# Patient Record
Sex: Male | Born: 1945 | Race: White | Hispanic: No | Marital: Single | Smoking: Former smoker
Health system: Southern US, Community
[De-identification: ages and names within clinical notes are randomized; demographics above are authoritative.]

## PROBLEM LIST (undated history)

## (undated) DIAGNOSIS — Z789 Other specified health status: Secondary | ICD-10-CM

---

## 2010-10-15 ENCOUNTER — Encounter (HOSPITAL_BASED_OUTPATIENT_CLINIC_OR_DEPARTMENT_OTHER)
Admission: RE | Admit: 2010-10-15 | Discharge: 2010-10-15 | Disposition: A | Discharge status: Home / Self Care | Payer: 59 | Source: Ambulatory Visit | Attending: Orthopedic Surgery | Admitting: Orthopedic Surgery

## 2010-10-16 ENCOUNTER — Ambulatory Visit (HOSPITAL_BASED_OUTPATIENT_CLINIC_OR_DEPARTMENT_OTHER)
Admission: RE | Admit: 2010-10-16 | Discharge: 2010-10-16 | Disposition: A | Discharge status: Home / Self Care | Payer: 59 | Source: Ambulatory Visit | Attending: Orthopedic Surgery | Admitting: Orthopedic Surgery

## 2010-10-16 DIAGNOSIS — Z01810 Encounter for preprocedural cardiovascular examination: Secondary | ICD-10-CM | POA: Insufficient documentation

## 2010-10-16 DIAGNOSIS — Z01812 Encounter for preprocedural laboratory examination: Secondary | ICD-10-CM | POA: Insufficient documentation

## 2010-10-16 DIAGNOSIS — M6530 Trigger finger, unspecified finger: Secondary | ICD-10-CM | POA: Insufficient documentation

## 2010-11-12 NOTE — Op Note (Signed)
[name], [name] NO.:  [id number]  MEDICAL RECORD NO.:  [id number]  LOCATION:                                 FACILITY:  PHYSICIAN:  Betha Loa, MD        DATE OF BIRTH:  [birth year]  DATE OF PROCEDURE:  10/16/2010 DATE OF DISCHARGE:                              OPERATIVE REPORT   PREOPERATIVE DIAGNOSIS:  Right trigger thumb.  POSTOPERATIVE DIAGNOSIS:  Right trigger thumb.  PROCEDURE:  Right thumb A1 pulley release.  SURGEON:  Betha Loa, MD  ASSISTANT:  None.  ANESTHESIA:  Local with sedation.  INTRAVENOUS FLUIDS:  Per anesthesia flow sheet.  ESTIMATED BLOOD LOSS:  Minimal.  COMPLICATIONS:  None.  SPECIMENS:  None.  TOURNIQUET TIME:  20 minutes.  DISPOSITION:  Stable to PACU.  INDICATIONS:  Mr. Jay Livingston is a 65 year old male who has been seen my office for triggering of the right thumb.  We have injected the right thumb flexor sheath multiple times and the triggering has always recurred.  He now is at a point where he has difficulty flexing the thumb when he is not blocking at the IP joint.  He wishes to undergo release of the A1 pulley for treatment of the triggering.  Risks, benefits, and alternatives of surgery were discussed including the risks of blood loss, infection, damage to nerves, vessels, tendons, ligaments, and bone, failure of surgery, the need for additional surgery, complications with wound healing, continued pain, and continued triggering.  He voiced understanding these risks and elected to proceed.  OPERATIVE COURSE:  After being identified preoperatively by myself, the patient and I agreed upon the procedure and site of procedure.  The surgical site was marked.  The risks, benefits, and alternatives of surgery were reviewed and he wished to proceed.  Surgical consent had been signed.  He was given 1 g of IV Ancef as preoperative antibiotic prophylaxis.  He was transferred to the operating room and placed on  the operating table in supine position with the right upper extremity on an arm board.  General anesthesia was induced by the anesthesiologist.  The right upper extremity was prepped and draped in a normal sterile orthopedic fashion.  Surgical pause was performed between surgeons, Anesthesia, and operating staff and all were in agreement as to the patient, procedure, and site of procedure.  Local anesthesia was given with half-and-half solution of 1% plain Xylocaine and 0.25% of plain Marcaine.  This was adequate to give anesthesia in the operative field. The patient was also under sedation.  An incision was made at the flexion crease of the MP joint of the thumb.  This was made through the skin only.  Spreading technique was used in the deeper tissues.  Digital nerves were identified and were protected throughout the case.  The flexor tendon was identified.  The A1 pulley was identified and was incised in its entirety.  The FPL was brought through the wound and the IP joint was able to flex fully.  The patient was awoken from his sedation.  He was asked to flex the thumb and was able to fully flex the IP joint and fully extend the IP  joint.  He noted no triggering.  I was unable to feel any catching.  The wound was copiously irrigated with sterile saline.  It was closed with 4-0 nylon in a horizontal mattress fashion.  The wound was dressed with sterile Xeroform, 4 x 4's and wrapped with Kling and Coban dressing lightly.  The tourniquet was deflated at 20 minutes.  It had been inflated at the start of the case after exsanguination of the limb with an Esmarch bandage.  It was at 250 mmHg.  The fingertips were pink with brisk capillary refill after deflation of the tourniquet.  The operative drapes were broken down and the patient was awoken from anesthesia safely.  He was transferred back to stretcher and taken to PACU in stable condition.  I will see him back in the office in 1 week for  postoperative followup.  I will give him Percocet 5/325 one to two p.o. q.6 h. p.r.n. pain, dispensed #30.     Betha Loa, MD     KK/MEDQ  D:  10/16/2010  T:  10/16/2010  Job:  [id number]  Electronically Signed by Betha Loa  on 11/12/2010 11:29:32 AM

## 2016-07-29 ENCOUNTER — Inpatient Hospital Stay (HOSPITAL_COMMUNITY): Payer: Medicare Other

## 2016-07-29 ENCOUNTER — Inpatient Hospital Stay (HOSPITAL_COMMUNITY)
Admission: EM | Admit: 2016-07-29 | Discharge: 2016-09-02 | DRG: 215 | Disposition: E | Payer: Medicare Other | Attending: Interventional Cardiology | Admitting: Interventional Cardiology

## 2016-07-29 ENCOUNTER — Encounter (HOSPITAL_COMMUNITY): Payer: Self-pay | Admitting: Interventional Cardiology

## 2016-07-29 ENCOUNTER — Inpatient Hospital Stay (HOSPITAL_COMMUNITY): Admission: EM | Disposition: E | Payer: Self-pay | Source: Home / Self Care | Attending: Interventional Cardiology

## 2016-07-29 DIAGNOSIS — J9600 Acute respiratory failure, unspecified whether with hypoxia or hypercapnia: Secondary | ICD-10-CM

## 2016-07-29 DIAGNOSIS — Z515 Encounter for palliative care: Secondary | ICD-10-CM | POA: Diagnosis present

## 2016-07-29 DIAGNOSIS — I469 Cardiac arrest, cause unspecified: Secondary | ICD-10-CM

## 2016-07-29 DIAGNOSIS — R0689 Other abnormalities of breathing: Secondary | ICD-10-CM | POA: Diagnosis present

## 2016-07-29 DIAGNOSIS — N390 Urinary tract infection, site not specified: Secondary | ICD-10-CM | POA: Diagnosis present

## 2016-07-29 DIAGNOSIS — I998 Other disorder of circulatory system: Secondary | ICD-10-CM | POA: Diagnosis present

## 2016-07-29 DIAGNOSIS — Z9889 Other specified postprocedural states: Secondary | ICD-10-CM

## 2016-07-29 DIAGNOSIS — K7200 Acute and subacute hepatic failure without coma: Secondary | ICD-10-CM | POA: Diagnosis present

## 2016-07-29 DIAGNOSIS — I472 Ventricular tachycardia, unspecified: Secondary | ICD-10-CM

## 2016-07-29 DIAGNOSIS — E875 Hyperkalemia: Secondary | ICD-10-CM | POA: Diagnosis present

## 2016-07-29 DIAGNOSIS — G9340 Encephalopathy, unspecified: Secondary | ICD-10-CM | POA: Diagnosis not present

## 2016-07-29 DIAGNOSIS — I2510 Atherosclerotic heart disease of native coronary artery without angina pectoris: Secondary | ICD-10-CM | POA: Diagnosis present

## 2016-07-29 DIAGNOSIS — R740 Nonspecific elevation of levels of transaminase and lactic acid dehydrogenase [LDH]: Secondary | ICD-10-CM | POA: Diagnosis present

## 2016-07-29 DIAGNOSIS — I25119 Atherosclerotic heart disease of native coronary artery with unspecified angina pectoris: Secondary | ICD-10-CM

## 2016-07-29 DIAGNOSIS — E876 Hypokalemia: Secondary | ICD-10-CM | POA: Diagnosis present

## 2016-07-29 DIAGNOSIS — I213 ST elevation (STEMI) myocardial infarction of unspecified site: Principal | ICD-10-CM | POA: Diagnosis present

## 2016-07-29 DIAGNOSIS — D62 Acute posthemorrhagic anemia: Secondary | ICD-10-CM | POA: Diagnosis present

## 2016-07-29 DIAGNOSIS — J9690 Respiratory failure, unspecified, unspecified whether with hypoxia or hypercapnia: Secondary | ICD-10-CM

## 2016-07-29 DIAGNOSIS — G931 Anoxic brain damage, not elsewhere classified: Secondary | ICD-10-CM | POA: Diagnosis present

## 2016-07-29 DIAGNOSIS — J810 Acute pulmonary edema: Secondary | ICD-10-CM

## 2016-07-29 DIAGNOSIS — Z9289 Personal history of other medical treatment: Secondary | ICD-10-CM

## 2016-07-29 DIAGNOSIS — E872 Acidosis: Secondary | ICD-10-CM | POA: Diagnosis present

## 2016-07-29 DIAGNOSIS — R570 Cardiogenic shock: Secondary | ICD-10-CM | POA: Diagnosis present

## 2016-07-29 DIAGNOSIS — R7989 Other specified abnormal findings of blood chemistry: Secondary | ICD-10-CM | POA: Diagnosis present

## 2016-07-29 DIAGNOSIS — Z452 Encounter for adjustment and management of vascular access device: Secondary | ICD-10-CM

## 2016-07-29 DIAGNOSIS — I629 Nontraumatic intracranial hemorrhage, unspecified: Secondary | ICD-10-CM

## 2016-07-29 DIAGNOSIS — Z4659 Encounter for fitting and adjustment of other gastrointestinal appliance and device: Secondary | ICD-10-CM

## 2016-07-29 DIAGNOSIS — M62261 Nontraumatic ischemic infarction of muscle, right lower leg: Secondary | ICD-10-CM | POA: Diagnosis not present

## 2016-07-29 DIAGNOSIS — B952 Enterococcus as the cause of diseases classified elsewhere: Secondary | ICD-10-CM | POA: Diagnosis present

## 2016-07-29 DIAGNOSIS — N170 Acute kidney failure with tubular necrosis: Secondary | ICD-10-CM | POA: Diagnosis present

## 2016-07-29 DIAGNOSIS — R739 Hyperglycemia, unspecified: Secondary | ICD-10-CM | POA: Diagnosis present

## 2016-07-29 DIAGNOSIS — Z87891 Personal history of nicotine dependence: Secondary | ICD-10-CM

## 2016-07-29 DIAGNOSIS — R0602 Shortness of breath: Secondary | ICD-10-CM

## 2016-07-29 DIAGNOSIS — I5021 Acute systolic (congestive) heart failure: Secondary | ICD-10-CM | POA: Diagnosis present

## 2016-07-29 DIAGNOSIS — E871 Hypo-osmolality and hyponatremia: Secondary | ICD-10-CM | POA: Diagnosis present

## 2016-07-29 DIAGNOSIS — J9601 Acute respiratory failure with hypoxia: Secondary | ICD-10-CM

## 2016-07-29 DIAGNOSIS — N179 Acute kidney failure, unspecified: Secondary | ICD-10-CM | POA: Diagnosis not present

## 2016-07-29 DIAGNOSIS — L8990 Pressure ulcer of unspecified site, unspecified stage: Secondary | ICD-10-CM | POA: Diagnosis present

## 2016-07-29 DIAGNOSIS — I509 Heart failure, unspecified: Secondary | ICD-10-CM

## 2016-07-29 HISTORY — PX: VENTRICULAR ASSIST DEVICE INSERTION: CATH118273

## 2016-07-29 HISTORY — DX: Other specified health status: Z78.9

## 2016-07-29 HISTORY — PX: LEFT HEART CATH AND CORONARY ANGIOGRAPHY: CATH118249

## 2016-07-29 LAB — POCT I-STAT, CHEM 8
BUN: 13 mg/dL (ref 6–20)
BUN: 16 mg/dL (ref 6–20)
BUN: 15 mg/dL (ref 6–20)
BUN: 15 mg/dL (ref 6–20)
BUN: 14 mg/dL (ref 6–20)
CHLORIDE: 105 mmol/L (ref 101–111)
CHLORIDE: 104 mmol/L (ref 101–111)
CHLORIDE: 105 mmol/L (ref 101–111)
CREATININE: 0.9 mg/dL (ref 0.61–1.24)
CREATININE: 0.7 mg/dL (ref 0.61–1.24)
CREATININE: 0.8 mg/dL (ref 0.61–1.24)
Calcium, Ion: 1.14 mmol/L — ABNORMAL LOW (ref 1.15–1.40)
Calcium, Ion: 1.17 mmol/L (ref 1.15–1.40)
Calcium, Ion: 1.17 mmol/L (ref 1.15–1.40)
Calcium, Ion: 1.18 mmol/L (ref 1.15–1.40)
Calcium, Ion: 1.2 mmol/L (ref 1.15–1.40)
Chloride: 104 mmol/L (ref 101–111)
Chloride: 107 mmol/L (ref 101–111)
Creatinine, Ser: 0.9 mg/dL (ref 0.61–1.24)
Creatinine, Ser: 0.8 mg/dL (ref 0.61–1.24)
GLUCOSE: 358 mg/dL — AB (ref 65–99)
GLUCOSE: 355 mg/dL — AB (ref 65–99)
GLUCOSE: 307 mg/dL — AB (ref 65–99)
GLUCOSE: 147 mg/dL — AB (ref 65–99)
Glucose, Bld: 211 mg/dL — ABNORMAL HIGH (ref 65–99)
HCT: 42 % (ref 39.0–52.0)
HCT: 41 % (ref 39.0–52.0)
HCT: 36 % — ABNORMAL LOW (ref 39.0–52.0)
HEMATOCRIT: 44 % (ref 39.0–52.0)
HEMATOCRIT: 37 % — AB (ref 39.0–52.0)
HEMOGLOBIN: 15 g/dL (ref 13.0–17.0)
HEMOGLOBIN: 13.9 g/dL (ref 13.0–17.0)
HEMOGLOBIN: 12.6 g/dL — AB (ref 13.0–17.0)
HEMOGLOBIN: 12.2 g/dL — AB (ref 13.0–17.0)
Hemoglobin: 14.3 g/dL (ref 13.0–17.0)
POTASSIUM: 3.6 mmol/L (ref 3.5–5.1)
POTASSIUM: 4.1 mmol/L (ref 3.5–5.1)
POTASSIUM: 3.6 mmol/L (ref 3.5–5.1)
Potassium: 4.1 mmol/L (ref 3.5–5.1)
Potassium: 3.2 mmol/L — ABNORMAL LOW (ref 3.5–5.1)
Sodium: 140 mmol/L (ref 135–145)
Sodium: 140 mmol/L (ref 135–145)
Sodium: 140 mmol/L (ref 135–145)
Sodium: 141 mmol/L (ref 135–145)
Sodium: 142 mmol/L (ref 135–145)
TCO2: 21 mmol/L (ref 0–100)
TCO2: 24 mmol/L (ref 0–100)
TCO2: 25 mmol/L (ref 0–100)
TCO2: 25 mmol/L (ref 0–100)
TCO2: 21 mmol/L (ref 0–100)

## 2016-07-29 LAB — POCT I-STAT 3, ART BLOOD GAS (G3+)
Acid-base deficit: 5 mmol/L — ABNORMAL HIGH (ref 0.0–2.0)
BICARBONATE: 24 mmol/L (ref 20.0–28.0)
Bicarbonate: 20.8 mmol/L (ref 20.0–28.0)
O2 SAT: 98 %
O2 Saturation: 100 %
PCO2 ART: 40.1 mmHg (ref 32.0–48.0)
PH ART: 7.467 — AB (ref 7.350–7.450)
PO2 ART: 265 mmHg — AB (ref 83.0–108.0)
Patient temperature: 92.4
TCO2: 22 mmol/L (ref 0–100)
TCO2: 25 mmol/L (ref 0–100)
pCO2 arterial: 32.1 mmHg (ref 32.0–48.0)
pH, Arterial: 7.323 — ABNORMAL LOW (ref 7.350–7.450)
pO2, Arterial: 110 mmHg — ABNORMAL HIGH (ref 83.0–108.0)

## 2016-07-29 LAB — URINALYSIS, ROUTINE W REFLEX MICROSCOPIC
BILIRUBIN URINE: NEGATIVE
KETONES UR: NEGATIVE mg/dL
LEUKOCYTES UA: NEGATIVE
NITRITE: NEGATIVE
PH: 5 (ref 5.0–8.0)
Protein, ur: 100 mg/dL — AB

## 2016-07-29 LAB — PROTIME-INR
INR: 1.09
INR: 1.18
PROTHROMBIN TIME: 14.1 s (ref 11.4–15.2)
Prothrombin Time: 15.1 seconds (ref 11.4–15.2)

## 2016-07-29 LAB — BASIC METABOLIC PANEL
ANION GAP: 3 — AB (ref 5–15)
Anion gap: 5 (ref 5–15)
BUN: 13 mg/dL (ref 6–20)
BUN: 12 mg/dL (ref 6–20)
CALCIUM: 7.9 mg/dL — AB (ref 8.9–10.3)
CALCIUM: 7.8 mg/dL — AB (ref 8.9–10.3)
CO2: 22 mmol/L (ref 22–32)
CO2: 24 mmol/L (ref 22–32)
CREATININE: 0.88 mg/dL (ref 0.61–1.24)
Chloride: 111 mmol/L (ref 101–111)
Chloride: 112 mmol/L — ABNORMAL HIGH (ref 101–111)
Creatinine, Ser: 0.97 mg/dL (ref 0.61–1.24)
GFR calc Af Amer: >60 mL/min (ref >60)
GFR calc Af Amer: >60 mL/min (ref >60)
GLUCOSE: 155 mg/dL — AB (ref 65–99)
GLUCOSE: 148 mg/dL — AB (ref 65–99)
Potassium: 3.2 mmol/L — ABNORMAL LOW (ref 3.5–5.1)
Potassium: 3.5 mmol/L (ref 3.5–5.1)
Sodium: 138 mmol/L (ref 135–145)
Sodium: 139 mmol/L (ref 135–145)

## 2016-07-29 LAB — COMPREHENSIVE METABOLIC PANEL
ALT: 82 U/L — ABNORMAL HIGH (ref 17–63)
ANION GAP: 9 (ref 5–15)
AST: 127 U/L — ABNORMAL HIGH (ref 15–41)
Albumin: 3.1 g/dL — ABNORMAL LOW (ref 3.5–5.0)
Alkaline Phosphatase: 55 U/L (ref 38–126)
BUN: 10 mg/dL (ref 6–20)
CHLORIDE: 108 mmol/L (ref 101–111)
CO2: 21 mmol/L — AB (ref 22–32)
Calcium: 8 mg/dL — ABNORMAL LOW (ref 8.9–10.3)
Creatinine, Ser: 1.14 mg/dL (ref 0.61–1.24)
Glucose, Bld: 346 mg/dL — ABNORMAL HIGH (ref 65–99)
POTASSIUM: 3.5 mmol/L (ref 3.5–5.1)
SODIUM: 138 mmol/L (ref 135–145)
Total Bilirubin: 1 mg/dL (ref 0.3–1.2)
Total Protein: 6 g/dL — ABNORMAL LOW (ref 6.5–8.1)

## 2016-07-29 LAB — GLUCOSE, CAPILLARY
GLUCOSE-CAPILLARY: 249 mg/dL — AB (ref 65–99)
GLUCOSE-CAPILLARY: 148 mg/dL — AB (ref 65–99)
GLUCOSE-CAPILLARY: 143 mg/dL — AB (ref 65–99)
GLUCOSE-CAPILLARY: 132 mg/dL — AB (ref 65–99)
Glucose-Capillary: 317 mg/dL — ABNORMAL HIGH (ref 65–99)
Glucose-Capillary: 144 mg/dL — ABNORMAL HIGH (ref 65–99)
Glucose-Capillary: 128 mg/dL — ABNORMAL HIGH (ref 65–99)

## 2016-07-29 LAB — CBC
HCT: 42.6 % (ref 39.0–52.0)
Hemoglobin: 13.9 g/dL (ref 13.0–17.0)
MCH: 30.5 pg (ref 26.0–34.0)
MCHC: 32.6 g/dL (ref 30.0–36.0)
MCV: 93.6 fL (ref 78.0–100.0)
Platelets: 166 10*3/uL (ref 150–400)
RBC: 4.55 MIL/uL (ref 4.22–5.81)
RDW: 13.4 % (ref 11.5–15.5)
WBC: 12.2 10*3/uL — ABNORMAL HIGH (ref 4.0–10.5)

## 2016-07-29 LAB — LIPID PANEL
CHOL/HDL RATIO: 4.6 ratio
CHOLESTEROL: 189 mg/dL (ref 0–200)
HDL: 41 mg/dL (ref >40)
LDL CALC: 131 mg/dL — AB (ref 0–99)
TRIGLYCERIDES: 83 mg/dL (ref <150)
VLDL: 17 mg/dL (ref 0–40)

## 2016-07-29 LAB — SURGICAL PCR SCREEN
MRSA, PCR: NEGATIVE
Staphylococcus aureus: POSITIVE — AB

## 2016-07-29 LAB — APTT
APTT: 63 s — AB (ref 24–36)
aPTT: 31 seconds (ref 24–36)

## 2016-07-29 LAB — POCT ACTIVATED CLOTTING TIME
ACTIVATED CLOTTING TIME: 164 s
Activated Clotting Time: 296 seconds
Activated Clotting Time: 136 seconds
Activated Clotting Time: 136 seconds

## 2016-07-29 LAB — TROPONIN I
TROPONIN I: 2.64 ng/mL — AB (ref <0.03)
Troponin I: 0.07 ng/mL (ref <0.03)

## 2016-07-29 LAB — ECHOCARDIOGRAM COMPLETE
Height: 70 in
Weight: 3160.51 oz

## 2016-07-29 LAB — MAGNESIUM: Magnesium: 1.8 mg/dL (ref 1.7–2.4)

## 2016-07-29 SURGERY — LEFT HEART CATH AND CORONARY ANGIOGRAPHY
Anesthesia: LOCAL

## 2016-07-29 MED ORDER — SODIUM CHLORIDE 0.9 % IV SOLN
INTRAVENOUS | Status: DC | PRN
  Administered 2016-07-29: 1.75 mg/kg/h via INTRAVENOUS

## 2016-07-29 MED ORDER — MIDAZOLAM HCL 2 MG/2ML IJ SOLN: 1.0000 mg | Freq: Once | INTRAMUSCULAR | Status: DC

## 2016-07-29 MED ORDER — NOREPINEPHRINE BITARTRATE 1 MG/ML IV SOLN
0.0000 ug/min | INTRAVENOUS | Status: DC
  Administered 2016-07-29: 2 ug/min via INTRAVENOUS
  Administered 2016-07-30: 18 ug/min via INTRAVENOUS
  Administered 2016-07-30: 24 ug/min via INTRAVENOUS
  Administered 2016-07-30: 30 ug/min via INTRAVENOUS
  Administered 2016-07-30: 11 ug/min via INTRAVENOUS
  Administered 2016-07-31: 14 ug/min via INTRAVENOUS
  Administered 2016-07-31: 15 ug/min via INTRAVENOUS
  Administered 2016-08-01: 16 ug/min via INTRAVENOUS
  Administered 2016-08-01: 25 ug/min via INTRAVENOUS
  Administered 2016-08-01: 20 ug/min via INTRAVENOUS
  Administered 2016-08-01: 15 ug/min via INTRAVENOUS
  Administered 2016-08-01: 25 ug/min via INTRAVENOUS
  Filled 2016-07-29 (×16): qty 4

## 2016-07-29 MED ORDER — CHLORHEXIDINE GLUCONATE CLOTH 2 % EX PADS
6.0000 | MEDICATED_PAD | Freq: Every day | CUTANEOUS | Status: DC
  Administered 2016-07-29 – 2016-07-30 (×2): 6 via TOPICAL

## 2016-07-29 MED ORDER — SODIUM CHLORIDE 0.9 % IV SOLN
INTRAVENOUS | Status: DC
  Administered 2016-07-29: 2.6 [IU]/h via INTRAVENOUS
  Filled 2016-07-29 (×2): qty 1

## 2016-07-29 MED ORDER — LIDOCAINE HCL (PF) 1 % IJ SOLN
INTRAMUSCULAR | Status: DC | PRN
  Administered 2016-07-29 (×2): 15 mL

## 2016-07-29 MED ORDER — CISATRACURIUM BOLUS VIA INFUSION: 0.0500 mg/kg | INTRAVENOUS | Status: DC | PRN

## 2016-07-29 MED ORDER — FENTANYL CITRATE (PF) 100 MCG/2ML IJ SOLN: 100.0000 ug | Freq: Once | INTRAMUSCULAR | Status: DC | PRN

## 2016-07-29 MED ORDER — NITROGLYCERIN 1 MG/10 ML FOR IR/CATH LAB
INTRA_ARTERIAL | Status: AC
  Filled 2016-07-29: qty 10

## 2016-07-29 MED ORDER — AMIODARONE HCL IN DEXTROSE 360-4.14 MG/200ML-% IV SOLN
30.0000 mg/h | INTRAVENOUS | Status: DC
  Administered 2016-07-30 – 2016-07-31 (×5): 60 mg/h via INTRAVENOUS
  Administered 2016-07-31 – 2016-08-04 (×7): 30 mg/h via INTRAVENOUS
  Filled 2016-07-29 (×17): qty 200

## 2016-07-29 MED ORDER — SODIUM CHLORIDE 0.9 % IV SOLN
INTRAVENOUS | Status: AC | PRN
  Administered 2016-07-29: 100 mL/h via INTRAVENOUS

## 2016-07-29 MED ORDER — AMIODARONE HCL IN DEXTROSE 360-4.14 MG/200ML-% IV SOLN: 30.0000 mg/h | INTRAVENOUS | Status: DC

## 2016-07-29 MED ORDER — FENTANYL CITRATE (PF) 100 MCG/2ML IJ SOLN: 100.0000 ug | Freq: Once | INTRAMUSCULAR | Status: DC

## 2016-07-29 MED ORDER — ASPIRIN 300 MG RE SUPP
300.0000 mg | RECTAL | Status: AC
  Administered 2016-07-29: 300 mg via RECTAL
  Filled 2016-07-29: qty 1

## 2016-07-29 MED ORDER — PNEUMOCOCCAL VAC POLYVALENT 25 MCG/0.5ML IJ INJ: 0.5000 mL | INJECTION | INTRAMUSCULAR | Status: DC | PRN

## 2016-07-29 MED ORDER — SODIUM CHLORIDE 0.9 % IV SOLN: 2.0000 mg/h | INTRAVENOUS | Status: DC

## 2016-07-29 MED ORDER — HEPARIN (PORCINE) IN NACL 2-0.9 UNIT/ML-% IJ SOLN
INTRAMUSCULAR | Status: AC
  Filled 2016-07-29: qty 1000

## 2016-07-29 MED ORDER — ATROPINE SULFATE 1 MG/10ML IJ SOSY
PREFILLED_SYRINGE | INTRAMUSCULAR | Status: AC
  Filled 2016-07-29: qty 10

## 2016-07-29 MED ORDER — CISATRACURIUM BESYLATE (PF) 200 MG/20ML IV SOLN: 1.0000 ug/kg/min | INTRAVENOUS | Status: DC

## 2016-07-29 MED ORDER — MIDAZOLAM HCL 2 MG/2ML IJ SOLN: 2.0000 mg | Freq: Once | INTRAMUSCULAR | Status: DC | PRN

## 2016-07-29 MED ORDER — AMIODARONE LOAD VIA INFUSION
150.0000 mg | Freq: Once | INTRAVENOUS | Status: AC
  Administered 2016-07-29: 150 mg via INTRAVENOUS
  Filled 2016-07-29: qty 83.34

## 2016-07-29 MED ORDER — ASPIRIN 81 MG PO CHEW
81.0000 mg | CHEWABLE_TABLET | Freq: Every day | ORAL | Status: DC
  Administered 2016-07-30 – 2016-08-05 (×7): 81 mg via ORAL
  Filled 2016-07-29 (×7): qty 1

## 2016-07-29 MED ORDER — SODIUM CHLORIDE 0.9% FLUSH
3.0000 mL | Freq: Two times a day (BID) | INTRAVENOUS | Status: DC
  Administered 2016-07-29: 3 mL via INTRAVENOUS

## 2016-07-29 MED ORDER — SODIUM CHLORIDE 0.9 % IV SOLN
INTRAVENOUS | Status: DC
  Administered 2016-07-29: 14:00:00 via INTRAVENOUS
  Administered 2016-07-30: 50 mL/h via INTRAVENOUS
  Administered 2016-07-31: 15:00:00 via INTRAVENOUS

## 2016-07-29 MED ORDER — MIDAZOLAM BOLUS VIA INFUSION
1.0000 mg | INTRAVENOUS | Status: DC | PRN
  Filled 2016-07-29: qty 1

## 2016-07-29 MED ORDER — ACETAMINOPHEN 325 MG PO TABS: 650.0000 mg | ORAL_TABLET | ORAL | Status: DC | PRN

## 2016-07-29 MED ORDER — BIVALIRUDIN BOLUS VIA INFUSION - CUPID
INTRAVENOUS | Status: DC | PRN
  Administered 2016-07-29: 76.575 mg via INTRAVENOUS

## 2016-07-29 MED ORDER — CISATRACURIUM BOLUS VIA INFUSION
0.0500 mg/kg | INTRAVENOUS | Status: DC | PRN
  Filled 2016-07-29: qty 5

## 2016-07-29 MED ORDER — MUPIROCIN 2 % EX OINT
1.0000 "application " | TOPICAL_OINTMENT | Freq: Two times a day (BID) | CUTANEOUS | Status: AC
  Administered 2016-07-29 – 2016-08-03 (×10): 1 via NASAL
  Filled 2016-07-29 (×3): qty 22

## 2016-07-29 MED ORDER — MIDAZOLAM BOLUS VIA INFUSION: 2.0000 mg | INTRAVENOUS | Status: DC | PRN

## 2016-07-29 MED ORDER — SODIUM CHLORIDE 0.9 % IV SOLN
2.0000 mg/h | INTRAVENOUS | Status: DC
  Administered 2016-07-29: 2 mg/h via INTRAVENOUS
  Administered 2016-07-29 – 2016-07-30 (×3): 4 mg/h via INTRAVENOUS
  Filled 2016-07-29 (×4): qty 10

## 2016-07-29 MED ORDER — LIDOCAINE HCL (PF) 1 % IJ SOLN
INTRAMUSCULAR | Status: AC
  Filled 2016-07-29: qty 30

## 2016-07-29 MED ORDER — SODIUM CHLORIDE 0.9% FLUSH: 3.0000 mL | INTRAVENOUS | Status: DC | PRN

## 2016-07-29 MED ORDER — BIVALIRUDIN TRIFLUOROACETATE 250 MG IV SOLR
INTRAVENOUS | Status: AC
  Filled 2016-07-29: qty 250

## 2016-07-29 MED ORDER — SODIUM CHLORIDE 0.9 % IV SOLN: 1.0000 ug/kg/min | INTRAVENOUS | Status: DC

## 2016-07-29 MED ORDER — HEPARIN SODIUM (PORCINE) 5000 UNIT/ML IJ SOLN
50000.0000 [IU] | INTRAVENOUS | Status: DC
  Administered 2016-07-29 – 2016-07-31 (×3): 50000 [IU]
  Filled 2016-07-29 (×4): qty 10

## 2016-07-29 MED ORDER — MIDAZOLAM HCL 2 MG/2ML IJ SOLN: 2.0000 mg | Freq: Once | INTRAMUSCULAR | Status: DC

## 2016-07-29 MED ORDER — AMIODARONE LOAD VIA INFUSION
150.0000 mg | Freq: Once | INTRAVENOUS | Status: DC
  Filled 2016-07-29: qty 83.34

## 2016-07-29 MED ORDER — LABETALOL HCL 5 MG/ML IV SOLN: 10.0000 mg | INTRAVENOUS | Status: AC | PRN

## 2016-07-29 MED ORDER — FENTANYL 2500MCG IN NS 250ML (10MCG/ML) PREMIX INFUSION: 100.0000 ug/h | INTRAVENOUS | Status: DC

## 2016-07-29 MED ORDER — FENTANYL CITRATE (PF) 100 MCG/2ML IJ SOLN: 50.0000 ug | Freq: Once | INTRAMUSCULAR | Status: DC

## 2016-07-29 MED ORDER — CISATRACURIUM BOLUS VIA INFUSION
0.1000 mg/kg | Freq: Once | INTRAVENOUS | Status: DC
  Administered 2016-07-29: 10 mg via INTRAVENOUS

## 2016-07-29 MED ORDER — ASPIRIN 300 MG RE SUPP: 300.0000 mg | RECTAL | Status: DC

## 2016-07-29 MED ORDER — SODIUM CHLORIDE 0.9 % IV SOLN
1.0000 mg/h | INTRAVENOUS | Status: DC
  Administered 2016-07-29: 4 mg/h via INTRAVENOUS
  Administered 2016-07-29: 2 mg/h via INTRAVENOUS
  Administered 2016-07-29: 4 mg/h via INTRAVENOUS
  Filled 2016-07-29: qty 10

## 2016-07-29 MED ORDER — HEPARIN SODIUM (PORCINE) 5000 UNIT/ML IJ SOLN: 5000.0000 [IU] | Freq: Three times a day (TID) | INTRAMUSCULAR | Status: DC

## 2016-07-29 MED ORDER — ARTIFICIAL TEARS OPHTHALMIC OINT
1.0000 "application " | TOPICAL_OINTMENT | Freq: Three times a day (TID) | OPHTHALMIC | Status: DC
  Administered 2016-07-29 – 2016-07-31 (×5): 1 via OPHTHALMIC
  Filled 2016-07-29 (×2): qty 3.5

## 2016-07-29 MED ORDER — FENTANYL 2500MCG IN NS 250ML (10MCG/ML) PREMIX INFUSION
100.0000 ug/h | INTRAVENOUS | Status: DC
  Administered 2016-07-29: 300 ug/h via INTRAVENOUS
  Filled 2016-07-29: qty 250

## 2016-07-29 MED ORDER — PANTOPRAZOLE SODIUM 40 MG IV SOLR
40.0000 mg | Freq: Every day | INTRAVENOUS | Status: DC
  Administered 2016-07-29 – 2016-07-31 (×3): 40 mg via INTRAVENOUS
  Filled 2016-07-29 (×3): qty 40

## 2016-07-29 MED ORDER — HEPARIN (PORCINE) IN NACL 100-0.45 UNIT/ML-% IJ SOLN
500.0000 [IU]/h | INTRAMUSCULAR | Status: DC
  Administered 2016-07-29: 200 [IU]/h via INTRAVENOUS
  Administered 2016-07-31: 600 [IU]/h via INTRAVENOUS
  Filled 2016-07-29 (×2): qty 250

## 2016-07-29 MED ORDER — AMIODARONE HCL IN DEXTROSE 360-4.14 MG/200ML-% IV SOLN
60.0000 mg/h | INTRAVENOUS | Status: AC
  Administered 2016-07-29 (×2): 60 mg/h via INTRAVENOUS
  Filled 2016-07-29: qty 200

## 2016-07-29 MED ORDER — SODIUM CHLORIDE 0.9 % IV SOLN
25.0000 ug/h | INTRAVENOUS | Status: DC
  Administered 2016-07-29: 5 ug/h via INTRAVENOUS
  Filled 2016-07-29 (×2): qty 50

## 2016-07-29 MED ORDER — SODIUM CHLORIDE 0.9 % IV SOLN: 250.0000 mL | INTRAVENOUS | Status: DC | PRN

## 2016-07-29 MED ORDER — ORAL CARE MOUTH RINSE
15.0000 mL | OROMUCOSAL | Status: DC
  Administered 2016-07-29 – 2016-08-05 (×66): 15 mL via OROMUCOSAL

## 2016-07-29 MED ORDER — FENTANYL BOLUS VIA INFUSION
25.0000 ug | INTRAVENOUS | Status: DC | PRN
  Filled 2016-07-29: qty 25

## 2016-07-29 MED ORDER — AMIODARONE HCL IN DEXTROSE 360-4.14 MG/200ML-% IV SOLN: 60.0000 mg/h | INTRAVENOUS | Status: DC

## 2016-07-29 MED ORDER — CHLORHEXIDINE GLUCONATE 0.12% ORAL RINSE (MEDLINE KIT)
15.0000 mL | Freq: Two times a day (BID) | OROMUCOSAL | Status: DC
  Administered 2016-07-29 – 2016-08-05 (×15): 15 mL via OROMUCOSAL

## 2016-07-29 MED ORDER — CISATRACURIUM BOLUS VIA INFUSION
0.1000 mg/kg | Freq: Once | INTRAVENOUS | Status: AC
  Administered 2016-07-29: 10 mg via INTRAVENOUS
  Filled 2016-07-29: qty 10

## 2016-07-29 MED ORDER — POTASSIUM CHLORIDE 10 MEQ/50ML IV SOLN
10.0000 meq | INTRAVENOUS | Status: AC
  Administered 2016-07-29 (×2): 10 meq via INTRAVENOUS
  Filled 2016-07-29 (×2): qty 50

## 2016-07-29 MED ORDER — NOREPINEPHRINE BITARTRATE 1 MG/ML IV SOLN
0.0000 ug/min | INTRAVENOUS | Status: DC
  Filled 2016-07-29: qty 4

## 2016-07-29 MED ORDER — IOPAMIDOL (ISOVUE-370) INJECTION 76%
INTRAVENOUS | Status: AC
  Filled 2016-07-29: qty 125

## 2016-07-29 MED ORDER — SODIUM CHLORIDE 0.9 % IV SOLN
1.0000 ug/kg/min | INTRAVENOUS | Status: DC
  Administered 2016-07-29: 1 ug/kg/min via INTRAVENOUS
  Administered 2016-07-30: 1.5 ug/kg/min via INTRAVENOUS
  Filled 2016-07-29 (×2): qty 20

## 2016-07-29 MED ORDER — IOPAMIDOL (ISOVUE-370) INJECTION 76%
INTRAVENOUS | Status: DC | PRN
  Administered 2016-07-29: 95 mL

## 2016-07-29 MED ORDER — ONDANSETRON HCL 4 MG/2ML IJ SOLN: 4.0000 mg | Freq: Four times a day (QID) | INTRAMUSCULAR | Status: DC | PRN

## 2016-07-29 MED ORDER — HYDRALAZINE HCL 20 MG/ML IJ SOLN: 5.0000 mg | INTRAMUSCULAR | Status: AC | PRN

## 2016-07-29 MED ORDER — FENTANYL 2500MCG IN NS 250ML (10MCG/ML) PREMIX INFUSION
25.0000 ug/h | INTRAVENOUS | Status: DC
  Administered 2016-07-30 (×3): 300 ug/h via INTRAVENOUS
  Filled 2016-07-29 (×3): qty 250

## 2016-07-29 SURGICAL SUPPLY — 15 items
CATH EXTRAC PRONTO 5.5F 138CM (CATHETERS) ×2 IMPLANT
CATH INFINITI 5FR MULTPACK ANG (CATHETERS) ×2 IMPLANT
CATH LAUNCHER 6FR EBU3.5 (CATHETERS) ×2 IMPLANT
HOVERMATT SINGLE USE (MISCELLANEOUS) ×2 IMPLANT
KIT ENCORE 26 ADVANTAGE (KITS) ×2 IMPLANT
KIT HEART LEFT (KITS) ×2 IMPLANT
PACK CARDIAC CATHETERIZATION (CUSTOM PROCEDURE TRAY) ×2 IMPLANT
SET IMPELLA CP PUMP (CATHETERS) ×2 IMPLANT
SHEATH PINNACLE 6F 10CM (SHEATH) ×4 IMPLANT
TRANSDUCER W/STOPCOCK (MISCELLANEOUS) ×2 IMPLANT
TUBING CIL FLEX 10 FLL-RA (TUBING) ×2 IMPLANT
VALVE GUARDIAN II ~~LOC~~ HEMO (MISCELLANEOUS) ×2 IMPLANT
WIRE ASAHI PROWATER 180CM (WIRE) ×2 IMPLANT
WIRE EMERALD 3MM-J .035X150CM (WIRE) ×2 IMPLANT
WIRE FIGHTER CROSSING 190CM (WIRE) ×2 IMPLANT

## 2016-07-29 NOTE — Progress Notes (Signed)
Orthopedic Tech Progress Note Patient Details:  Jay AkinRichard Livingston 09/19/1945 [id number]  Ortho Devices Type of Ortho Device: Knee Immobilizer Ortho Device/Splint Location: rle Ortho Device/Splint Interventions: Application   [name] 07/09/2016, 12:57 PM As ordered by Dr. Vassie LollAlva

## 2016-07-29 NOTE — Progress Notes (Signed)
Responded to page to ED to minister to several family members of pt gone immediately to Cath lab. Provided spiritual/emotional support, ministry of presence,t and prayer  -- they especiallly appreciated latter -- during initial and f/u visits. [place]. [facility], and later visited again w/ them there. They're presently waiting to be allowed into pt's rm after he's settled into it. Chaplain available for f/u.   07/31/2016 1400  Clinical Encounter Type  Visited With Family;Health care provider  Visit Type Initial;Follow-up;Psychological support;Spiritual support;Social support;Patient in surgery;Post-op;Critical Care  Referral From Nurse  Spiritual Encounters  Spiritual Needs Prayer;Emotional  Stress Factors  Patient Stress Factors Health changes;Loss of control  Family Stress Factors Family relationships;Health changes;Loss of control   Ephraim Hamburgerynthia A Kimo Bancroft, [address]

## 2016-07-29 NOTE — Progress Notes (Signed)
Patient arrived to room from cath lab with Impella in place.  Orders received on arrival to start hypothermia protocol; pt already packed in ice from cath lab.  Right groin site with Impella was bleeding, site redressed using sterile technique, once hemostasis was achieved artic sun pads were applied and cooling began at 1315.

## 2016-07-29 NOTE — H&P (Signed)
History & Physical    Patient ID: Jay Livingston MRN: [id number], DOB/AGE: 1945-06-10   Admit date: 07/18/2016  Primary Physician: Patient, No Pcp Per Primary Cardiologist: Jay Livingston  Patient Profile    71 yo male with no known PMH of CAD who presented as a cardiac arrest from work.   Past Medical History    History obtained from the chart as patient is intubated. No prior hx noted.    Past Surgical History:  Procedure Laterality Date  . LEFT HEART CATH AND CORONARY ANGIOGRAPHY N/A 07/13/2016   Procedure: Left Heart Cath and Coronary Angiography;  Surgeon: Jay Crafts, MD;  Location: [facility] INVASIVE CV LAB;  Service: Cardiovascular;  Laterality: N/A;  . VENTRICULAR ASSIST DEVICE INSERTION N/A 07/09/2016   Procedure: Ventricular Assist Device Insertion;  Surgeon: Jay Crafts, MD;  Location: [facility] INVASIVE CV LAB;  Service: Cardiovascular;  Laterality: N/A;     Allergies  No Allergies noted.  History of Present Illness    Jay Livingston is a 71 yo male with no known PMH who presented as a cardiac arrest. On the morning of 07/10/2016 he was sitting at his desk at work and found unresponsive with agonal respirations. Reported to have been talking on the phone while at work and went unresponsive, co-workers then found him. Had 10 minutes of downtimes prior to EMS arrival. On EMS arrival, noted in VT and pulseless. ACLS was done for 22 minutes before ROSC.   In the ED he was intubated and brought directly to the cath labs for cardiac catheterization. Labs showed stable electrolytes, Cr 1.14, trop 0.07. Intial EKG showed lateral ST elevation. Of noted family reports no known history, he does not see a doctor on regular basis.   Home Medications    Prior to Admission medications   Not on File    Family History    No known family hx reported.   Social History    Social History   Social History  . Marital status: Single    Spouse name: N/A  . Number of children: N/A  .  Years of education: N/A   Occupational History  . Not on file.   Social History Main Topics  . Smoking status: Not on file  . Smokeless tobacco: Not on file  . Alcohol use Not on file  . Drug use: Unknown  . Sexual activity: Not on file   Other Topics Concern  . Not on file   Social History Narrative  . No narrative on file     Review of Systems    Unable to obtain as patient intubated  Physical Exam    Blood pressure (!) 186/126, pulse 93, resp. rate 17, height 5\' 10"  (1.778 m), weight 197 lb 8.5 oz (89.6 kg), SpO2 100 %.  General:Intubated Neuro: Sedated HEENT: Normal  Neck: Supple without bruits or JVD. Lungs:  Resp regular and unlabored, CTA. Heart: RRR no s3, s4, or murmurs. Abdomen: Soft, non-tender, non-distended, BS + x 4.  Extremities: No clubbing, cyanosis or edema. DP/PT/Radials 2+ and equal bilaterally.  Labs    Troponin (Point of Care Test) No results for input(s): TROPIPOC in the last 72 hours.  Recent Labs  07/13/2016 1105  TROPONINI 0.07*   Lab Results  Component Value Date   WBC 12.2 (H) 07/31/2016   HGB 13.9 07/23/2016   HCT 42.6 07/22/2016   MCV 93.6 07/22/2016   PLT 166 07/04/2016    Recent Labs Lab 07/11/2016 1105  NA 138  K 3.5  CL 108  CO2 21*  BUN 10  CREATININE 1.14  CALCIUM 8.0*  PROT 6.0*  BILITOT 1.0  ALKPHOS 55  ALT 82*  AST 127*  GLUCOSE 346*   Lab Results  Component Value Date   CHOL 189 07/20/2016   HDL 41 07/27/2016   LDLCALC 131 (H) 07/19/2016   TRIG 83 07/17/2016   No results found for: [facility]   Radiology Studies    Dg Abd 1 View  Result Date: 07/05/2016 CLINICAL DATA:  Encounter for NG and ET tube placement EXAM: ABDOMEN - 1 VIEW COMPARISON:  None. FINDINGS: Nasogastric tube extends into the decompressed stomach. Femoral catheter extends across the aortic valve and towards the left ventricular apex. Normal bowel gas pattern. No abnormal abdominal calcifications. The lower abdomen is excluded. Regional  bones unremarkable. IMPRESSION: 1. Unremarkable bowel gas pattern. 2. Hardware placement as above. Electronically Signed   By: Jay Leak  Hassell M.D.   On: 07/04/2016 13:49   Dg Chest Port 1 View  Result Date: 07/16/2016 CLINICAL DATA:  Encounter for NG and ET tube placement EXAM: PORTABLE CHEST - 1 VIEW COMPARISON:  none FINDINGS: The endotracheal tube tip is approximately 3.5 cm above carina. Nasogastric tube extends at least as far as the stomach, tip not seen. Femoral arterial catheter extends into the ascending aorta. Heart size and mediastinal contours are within normal limits. There is central pulmonary vascular congestion. Relatively low lung volumes. No effusion. Visualized bones unremarkable. IMPRESSION: 1. Low lung volumes with central pulmonary vascular congestion. 2. Support hardware placement as above. Electronically Signed   By: Jay Leak  Hassell M.D.   On: 07/04/2016 13:48    ECG & Cardiac Imaging    EKG: SR with lateral ST elevation  Assessment & Plan    71 yo male with no known PMH of CAD who presented as a cardiac arrest from work.  Cardiac Arrest/STEMI: Found unresponsive at work by co-workers, down 10 minutes prior to EMS arrival. 22 minutes of ACLS before ROSC. Initial EKG showed lateral ST elevation. Intubated in the ED and brought to the cath lab emergently.  -- further recommendations post cath. PCCM consult for vent management. Anticipation post MI medical management.  Jay Livingston, Jay Roberts, NP-C Pager [phone] 07/15/2016, 1:55 PM   I have examined the patient and reviewed assessment and plan and discussed with patient.  Agree with above as stated.  I personally reviewed the ECG and made the decision for him to come to the cath lab.  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  Unable to cross with a wire.  Elevated LVEDP.  Impella placed for hemodynamic support. PCCM following.  Cooling protocol in place.  If he has a meaningful neuro recovery, would benefit  from CABG.   Jay Livingston

## 2016-07-29 NOTE — Progress Notes (Signed)
ANTICOAGULATION CONSULT NOTE - Initial Consult  Pharmacy Consult for heparin Indication: Impella CP  Allergies not on file  Patient Measurements: Height: 5' 10" (177.8 cm) Weight: 197 lb 8.5 oz (89.6 kg) IBW/kg (Calculated) : 73 Heparin Dosing Weight: 89.6 kg  Vital Signs: BP: 186/126 (06/27 1205) Pulse Rate: 85 (06/27 1205)  Labs:  Recent Labs  07/07/2016 1105  HGB 13.9  HCT 42.6  PLT 166  APTT 31  LABPROT 14.1  INR 1.09  CREATININE 1.14  TROPONINI 0.07*    Estimated Creatinine Clearance: 67.9 mL/min (by C-G formula based on SCr of 1.14 mg/dL).   Medical History: No past medical history on file.  Medications:  Scheduled:  . artificial tears  1 application Both Eyes Q8H  . aspirin  81 mg Oral Daily  . chlorhexidine gluconate (MEDLINE KIT)  15 mL Mouth Rinse BID  . cisatracurium  0.1 mg/kg (Order-Specific) Intravenous Once  . fentaNYL (SUBLIMAZE) injection  50 mcg Intravenous Once  . heparin  5,000 Units Subcutaneous Q8H  . mouth rinse  15 mL Mouth Rinse 10 times per day  . midazolam  1 mg Intravenous Once  . pantoprazole (PROTONIX) IV  40 mg Intravenous QHS  . sodium chloride flush  3 mL Intravenous Q12H    Assessment: 70 yo male admitted s/p VF arrest, STEMI.  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  Awaiting CABG consult eventually once more stable.  Goal of Therapy:  ACTs at goal Monitor platelets by anticoagulation protocol: Yes   Plan:  1. Heparin at 50 units/ml through Impella purge solution. 2. Systemic IV heparin to be added by RN as needed to maintain ACTs at goal. 3. Pharmacy will continue to monitor peripherally to assist with titration as necessary.  Jessica Carney, Pharm D, BCPS  Clinical Pharmacist Pager [phone]  07/28/2016 1:52 PM    

## 2016-07-29 NOTE — Procedures (Signed)
Central Venous Catheter Insertion Procedure Note Jay AkinRichard Livingston [id number] 07/18/1945  Procedure: Insertion of Central Venous Catheter Indications: Assessment of intravascular volume and Drug and/or fluid administration  Procedure Details Consent: Unable to obtain consent because of emergent medical necessity. Time Out: Verified patient identification, verified procedure, site/side was marked, verified correct patient position, special equipment/implants available, medications/allergies/relevent history reviewed, required imaging and test results available.  Performed  Maximum sterile technique was used including antiseptics, cap, gloves, gown, hand hygiene, mask and sheet. Skin prep: Chlorhexidine; local anesthetic administered A antimicrobial bonded/coated triple lumen catheter was placed in the left internal jugular vein using the Seldinger technique.  Evaluation Blood flow good Complications: No apparent complications Patient did tolerate procedure well. Chest X-ray ordered to verify placement.  CXR: pending.  Performed using ultrasound guidance.  Wire visualized in vessel under ultrasound.   Dirk DressKaty Whiteheart, NP 07/14/2016  4:36 PM Pager: [phone] or [phone]

## 2016-07-29 NOTE — Progress Notes (Signed)
    Impella catheter positioned under echo guidance.  Valve to inflow is 4.2 cm.  Patient had more arrhythmia as the catheter was withdrawn more.  Will start IV Amiodarone.   Corky CraftsVaranasi, Kanyah Matsushima S, MD

## 2016-07-29 NOTE — Consult Note (Signed)
PULMONARY / CRITICAL CARE MEDICINE   Name: Jay Livingston MRN: [id number] DOB: [birth year]    ADMISSION DATE:  07/30/2016 CONSULTATION DATE:  07/17/2016  REFERRING MD:  Eldridge Dace  CHIEF COMPLAINT:  Cardiac Arrest  HISTORY OF PRESENT ILLNESS:  Pt is encephelopathic; therefore, this HPI is obtained from chart review. Jay Livingston is a 71 y.o. male with PMH as outlined below. On morning of 07/19/2016, he was at work sitting at his desk and was found unresponsive with agonal respirations. He was seen roughly 5 minutes prior to this and was normal.  He had 10 minutes of downtime and when EMS arrived, he was found to be in VT without a pulse.  ACLS was performed for 22 minutes before ROSC.  He was [facility] ED where he was intubated and taken to the cath lab emergently - found to have 3VD without interventions, IMpella placed  PCCM was asked to assist with vent management.  PAST MEDICAL HISTORY :  He  has no past medical history on file.  PAST SURGICAL HISTORY: He  has no past surgical history on file.  Allergies not on file  No current facility-administered medications on file prior to encounter.    No current outpatient prescriptions on file prior to encounter.    FAMILY HISTORY:  His has no family status information on file.    SOCIAL HISTORY: None available  REVIEW OF SYSTEMS:   Unable to obtain as pt is encephalopathic.  SUBJECTIVE:  On vent, unresponsive.  VITAL SIGNS: BP (!) 186/126   Pulse 85   Resp 20   SpO2 100%   HEMODYNAMICS:    VENTILATOR SETTINGS:    INTAKE / OUTPUT: No intake/output data recorded.   PHYSICAL EXAMINATION: General: comatose, elderly, oral ETT Neuro: GCS 3, posturing both UEs to DPS, pupils 3mm BERTL HEENT: no pallor, icterus Cardiovascular: s1s2 tachy Lungs: BL clear  Abdomen: soft, non tender Musculoskeletal: no deformity, lt shin I/o Skin: no rash , sheath both groins  LABS:  BMET  Recent Labs Lab 07/12/2016 1105  NA 138   K 3.5  CL 108  CO2 21*  BUN 10  CREATININE 1.14  GLUCOSE 346*    Electrolytes  Recent Labs Lab 07/26/2016 1105  CALCIUM 8.0*    CBC  Recent Labs Lab 07/26/2016 1105  WBC 12.2*  HGB 13.9  HCT 42.6  PLT 166    Coag's  Recent Labs Lab 07/17/2016 1105  APTT 31  INR 1.09    Sepsis Markers No results for input(s): LATICACIDVEN, PROCALCITON, O2SATVEN in the last 168 hours.  ABG No results for input(s): PHART, PCO2ART, PO2ART in the last 168 hours.  Liver Enzymes  Recent Labs Lab 07/17/2016 1105  AST 127*  ALT 82*  ALKPHOS 55  BILITOT 1.0  ALBUMIN 3.1*    Cardiac Enzymes  Recent Labs Lab 07/10/2016 1105  TROPONINI 0.07*    Glucose No results for input(s): GLUCAP in the last 168 hours.  Imaging No results found.   STUDIES:  CXR 6/27 >  Cath 6/27 >  Echo 6/27 >  EEG 6/27 >   CULTURES: None.  ANTIBIOTICS: None.  SIGNIFICANT EVENTS: 6/27 > admit.  LINES/TUBES: ETT 6/27 >   DISCUSSION: 71 y.o. male admitted 6/27 after VT cardiac arrest. Had 10 minutes downtime before ACLS started then 22 minutes prior to ROSC.  Intubated and taken to cath lab emergently.  ASSESSMENT / PLAN:  PULMONARY A: Respiratory insufficiency - following VT cardiac arrest. P:   Full  vent support- settings reviewed & adjusted Wean as able. VAP prevention measures. CXR in AM.  CARDIOVASCULAR A:  VT arrest - s/p emergent cath 6/27 with 3VD s/p impella placement. P:  Post procedural management per cardiology. Levophed as needed for goal MAP > 80. Assess  echo. Hold preadmission meds.  RENAL A:   Mild hypokalemia P:   NS @ 100. Correct electrolytes as indicated. BMP q2hrs x 4 then q4hrs.  GASTROINTESTINAL A:   GI prophylaxis. Nutrition. P:   SUP: Pantoprazole. NPO.   HEMATOLOGIC A:   VTE Prophylaxis. At risk for hypothermia induced coagulopathy. P:  SCD's. Coags q8hrs x 2 then in AM. CBC in AM.  INFECTIOUS A:   No indication of  infection. P:   Monitor clinically.  ENDOCRINE A:   At risk for hypothermia induced hyperglycemia.   P:   ICU hyperglycemia protocol.  NEUROLOGIC A:   Acute encephalopathy - concern for anoxic brain injury. P:   Hypothermia protocol Sedation:  Versed/ fent gtt RASS goal: -5 for duration of NMB. Daily WUA. Assess EEG.    Family updated: wife  Interdisciplinary Family Meeting v Palliative Care Meeting:  Due by: 7ds  CC time: 6632m  Cyril Mourningakesh Shawne Bulow MD. [facility] LLCFCCP. [facility] Pager [phone] If no response call [phone]   07/11/2016

## 2016-07-29 NOTE — Procedures (Signed)
Arterial Catheter Insertion Procedure Note Ashley AkinRichard Haseman [id number] 09/18/1945  Procedure: Insertion of Arterial Catheter  Indications: Blood pressure monitoring and Frequent blood sampling  Procedure Details Consent: Unable to obtain consent because of on ventilator. Time Out: Verified patient identification, verified procedure, site/side was marked, verified correct patient position, special equipment/implants available, medications/allergies/relevent history reviewed, required imaging and test results available.  Performed  Maximum sterile technique was used including antiseptics, cap, gloves, gown, hand hygiene, mask and sheet. Skin prep: Chlorhexidine; local anesthetic administered 20 gauge catheter was inserted into left radial artery using the Seldinger technique.  Evaluation Blood flow good; BP tracing good. Complications: No apparent complications.   Durwin GlazeBrown, Rosio Weiss N 07/10/2016

## 2016-07-29 NOTE — Progress Notes (Signed)
EEG Completed; Results Pending  

## 2016-07-29 NOTE — Progress Notes (Signed)
Left arterial sheath held for 30 minutes. Patient sedated and vital signs remained consistent throughout procedure. Groin level 0 s/p sheath pull. Pressure dressing applied.

## 2016-07-29 NOTE — Progress Notes (Signed)
eLink Physician-Brief Progress Note Patient Name: Jay AkinRichard Goerke DOB: 05/25/1945 MRN: [id number]   Date of Service  07/19/2016  HPI/Events of Note  K slightly low.   eICU Interventions  Replaced. Ordered mag.      Intervention Category Intermediate Interventions: Electrolyte abnormality - evaluation and management  Shane Crutchradeep Chontel Warning 08/01/2016, 6:14 PM

## 2016-07-29 NOTE — Progress Notes (Signed)
  Echocardiogram 2D Echocardiogram has been performed.  Jay Livingston 03/11/16, 4:35 PM

## 2016-07-29 NOTE — Procedures (Addendum)
ELECTROENCEPHALOGRAM REPORT  Date of Study: 07/13/2016  Patient's Name: Jay Livingston MRN: [id number] Date of Birth: 11-Jan-1946  Referring Provider: Dr. Cyril Mourningakesh Alva  Clinical History: This is a 71 year old man s/p cardiac arrest, on hypothermia protocol  Medications: fentaNYL (SUBLIMAZE) 2,500 mcg in sodium chloride 0.9 % 250 mL (10 mcg/mL) infusion  midazolam (VERSED) 50 mg in sodium chloride 0.9 % 50 mL (1 mg/mL) infusion  acetaminophen (TYLENOL) tablet 650 mg  aspirin chewable tablet 81 mg  cisatracurium (NIMBEX) 200 mg in sodium chloride 0.9 % 200 mL (1 mg/mL) infusion  heparin 50,000 Units in dextrose 5 % 1,000 mL (50 Units/mL)  hydrALAZINE (APRESOLINE) injection 5 mg  insulin regular (NOVOLIN R,HUMULIN R) 100 Units in sodium chloride 0.9 % 100 mL (1 Units/mL) infusion  labetalol (NORMODYNE,TRANDATE) injection 10 mg  norepinephrine (LEVOPHED) 4 mg in dextrose 5 % 250 mL (0.016 mg/mL) infusion  ondansetron (ZOFRAN) injection 4 mg  pantoprazole (PROTONIX) injection 40 mg   Technical Summary: A multichannel digital EEG recording measured by the international 10-20 system with electrodes applied with paste and impedances below 5000 ohms performed in our laboratory with EKG monitoring in an intubated and sedated patient starting hypothermia protocol, temperature during EEG 35.7 degrees Celsius.  Hyperventilation and photic stimulation were not performed.  The digital EEG was referentially recorded, reformatted, and digitally filtered in a variety of bipolar and referential montages for optimal display.    Description: The patient is intubated and sedated on Versed and Fentanyl during the recording. There is loss of normal background activity. The record read at a sensitivity of 3 uV/mm shows diffuse suppression and slowing of background activity, with occasional low voltage diffuse beta activity seen, at times sharply contoured over the right posterior temporal region. There is no  spontaneous reactivity or reactivity noted with noxious stimulation. Hyperventilation and photic stimulation were not performed. There were no epileptiform discharges or electrographic seizures seen.   EKG lead was unremarkable.  Impression: This EEG is markedly abnormal due to diffuse background suppression and slowing.  Clinical Correlation of the above findings indicates severe diffuse cerebral dysfunction that is non-specific in etiology and can be seen in the setting of anoxic/ischemic injury, toxic/metabolic encephalopathies, or medication effect. There is slight asymmetry seen over the right posterior temporal region, however this is very low voltage and of unclear clinical significance. No electrographic seizures seen. Clinical correlation is advised.  Patrcia DollyKaren Preslyn Warr, M.D.

## 2016-07-30 ENCOUNTER — Inpatient Hospital Stay (HOSPITAL_COMMUNITY): Payer: Medicare Other

## 2016-07-30 ENCOUNTER — Encounter (HOSPITAL_COMMUNITY): Payer: Self-pay

## 2016-07-30 LAB — BASIC METABOLIC PANEL
Anion gap: 7 (ref 5–15)
Anion gap: 6 (ref 5–15)
BUN: 13 mg/dL (ref 6–20)
BUN: 13 mg/dL (ref 6–20)
CALCIUM: 7.3 mg/dL — AB (ref 8.9–10.3)
CALCIUM: 7.4 mg/dL — AB (ref 8.9–10.3)
CHLORIDE: 111 mmol/L (ref 101–111)
CHLORIDE: 111 mmol/L (ref 101–111)
CO2: 19 mmol/L — ABNORMAL LOW (ref 22–32)
CO2: 21 mmol/L — ABNORMAL LOW (ref 22–32)
CREATININE: 0.83 mg/dL (ref 0.61–1.24)
CREATININE: 0.79 mg/dL (ref 0.61–1.24)
Glucose, Bld: 170 mg/dL — ABNORMAL HIGH (ref 65–99)
Glucose, Bld: 150 mg/dL — ABNORMAL HIGH (ref 65–99)
Potassium: 3.2 mmol/L — ABNORMAL LOW (ref 3.5–5.1)
Potassium: 3.7 mmol/L (ref 3.5–5.1)
SODIUM: 137 mmol/L (ref 135–145)
SODIUM: 138 mmol/L (ref 135–145)

## 2016-07-30 LAB — POCT I-STAT 4, (NA,K, GLUC, HGB,HCT)
Glucose, Bld: 174 mg/dL — ABNORMAL HIGH (ref 65–99)
Glucose, Bld: 168 mg/dL — ABNORMAL HIGH (ref 65–99)
Glucose, Bld: 188 mg/dL — ABNORMAL HIGH (ref 65–99)
Glucose, Bld: 179 mg/dL — ABNORMAL HIGH (ref 65–99)
Glucose, Bld: 149 mg/dL — ABNORMAL HIGH (ref 65–99)
HCT: 47 % (ref 39.0–52.0)
HCT: 37 % — ABNORMAL LOW (ref 39.0–52.0)
HEMATOCRIT: 38 % — AB (ref 39.0–52.0)
HEMATOCRIT: 40 % (ref 39.0–52.0)
HEMATOCRIT: 40 % (ref 39.0–52.0)
HEMOGLOBIN: 16 g/dL (ref 13.0–17.0)
HEMOGLOBIN: 12.6 g/dL — AB (ref 13.0–17.0)
HEMOGLOBIN: 13.6 g/dL (ref 13.0–17.0)
HEMOGLOBIN: 13.6 g/dL (ref 13.0–17.0)
Hemoglobin: 12.9 g/dL — ABNORMAL LOW (ref 13.0–17.0)
POTASSIUM: 3.6 mmol/L (ref 3.5–5.1)
Potassium: 5.8 mmol/L — ABNORMAL HIGH (ref 3.5–5.1)
Potassium: 4.2 mmol/L (ref 3.5–5.1)
Potassium: 3.7 mmol/L (ref 3.5–5.1)
Potassium: 3.9 mmol/L (ref 3.5–5.1)
SODIUM: 139 mmol/L (ref 135–145)
SODIUM: 139 mmol/L (ref 135–145)
Sodium: 140 mmol/L (ref 135–145)
Sodium: 140 mmol/L (ref 135–145)
Sodium: 140 mmol/L (ref 135–145)

## 2016-07-30 LAB — GLUCOSE, CAPILLARY
GLUCOSE-CAPILLARY: 102 mg/dL — AB (ref 65–99)
GLUCOSE-CAPILLARY: 140 mg/dL — AB (ref 65–99)
GLUCOSE-CAPILLARY: 147 mg/dL — AB (ref 65–99)
GLUCOSE-CAPILLARY: 149 mg/dL — AB (ref 65–99)
GLUCOSE-CAPILLARY: 154 mg/dL — AB (ref 65–99)
GLUCOSE-CAPILLARY: 157 mg/dL — AB (ref 65–99)
GLUCOSE-CAPILLARY: 164 mg/dL — AB (ref 65–99)
GLUCOSE-CAPILLARY: 177 mg/dL — AB (ref 65–99)
GLUCOSE-CAPILLARY: 194 mg/dL — AB (ref 65–99)
Glucose-Capillary: 162 mg/dL — ABNORMAL HIGH (ref 65–99)
Glucose-Capillary: 182 mg/dL — ABNORMAL HIGH (ref 65–99)
Glucose-Capillary: 163 mg/dL — ABNORMAL HIGH (ref 65–99)
Glucose-Capillary: 150 mg/dL — ABNORMAL HIGH (ref 65–99)
Glucose-Capillary: 146 mg/dL — ABNORMAL HIGH (ref 65–99)
Glucose-Capillary: 187 mg/dL — ABNORMAL HIGH (ref 65–99)
Glucose-Capillary: 194 mg/dL — ABNORMAL HIGH (ref 65–99)
Glucose-Capillary: 175 mg/dL — ABNORMAL HIGH (ref 65–99)
Glucose-Capillary: 180 mg/dL — ABNORMAL HIGH (ref 65–99)
Glucose-Capillary: 180 mg/dL — ABNORMAL HIGH (ref 65–99)
Glucose-Capillary: 182 mg/dL — ABNORMAL HIGH (ref 65–99)
Glucose-Capillary: 157 mg/dL — ABNORMAL HIGH (ref 65–99)

## 2016-07-30 LAB — POCT I-STAT, CHEM 8
BUN: 14 mg/dL (ref 6–20)
CHLORIDE: 106 mmol/L (ref 101–111)
CREATININE: 0.8 mg/dL (ref 0.61–1.24)
Calcium, Ion: 1.17 mmol/L (ref 1.15–1.40)
Glucose, Bld: 157 mg/dL — ABNORMAL HIGH (ref 65–99)
HEMATOCRIT: 35 % — AB (ref 39.0–52.0)
HEMOGLOBIN: 11.9 g/dL — AB (ref 13.0–17.0)
POTASSIUM: 3.3 mmol/L — AB (ref 3.5–5.1)
Sodium: 142 mmol/L (ref 135–145)
TCO2: 20 mmol/L (ref 0–100)

## 2016-07-30 LAB — POCT ACTIVATED CLOTTING TIME
ACTIVATED CLOTTING TIME: 120 s
ACTIVATED CLOTTING TIME: 153 s
ACTIVATED CLOTTING TIME: 169 s
ACTIVATED CLOTTING TIME: 169 s
Activated Clotting Time: 131 seconds
Activated Clotting Time: 147 seconds
Activated Clotting Time: 164 seconds
Activated Clotting Time: 169 seconds
Activated Clotting Time: 169 seconds
Activated Clotting Time: 169 seconds
Activated Clotting Time: 180 seconds
Activated Clotting Time: 175 seconds

## 2016-07-30 LAB — COOXEMETRY PANEL
CARBOXYHEMOGLOBIN: 1.4 % (ref 0.5–1.5)
Methemoglobin: 0.8 % (ref 0.0–1.5)
O2 SAT: 69 %
Total hemoglobin: 13.6 g/dL (ref 12.0–16.0)

## 2016-07-30 LAB — MAGNESIUM: MAGNESIUM: 2.4 mg/dL (ref 1.7–2.4)

## 2016-07-30 LAB — CBC
HCT: 38.5 % — ABNORMAL LOW (ref 39.0–52.0)
Hemoglobin: 13 g/dL (ref 13.0–17.0)
MCH: 30.4 pg (ref 26.0–34.0)
MCHC: 33.8 g/dL (ref 30.0–36.0)
MCV: 90 fL (ref 78.0–100.0)
PLATELETS: 173 10*3/uL (ref 150–400)
RBC: 4.28 MIL/uL (ref 4.22–5.81)
RDW: 13.4 % (ref 11.5–15.5)
WBC: 12.2 10*3/uL — AB (ref 4.0–10.5)

## 2016-07-30 LAB — PHOSPHORUS: Phosphorus: 1.4 mg/dL — ABNORMAL LOW (ref 2.5–4.6)

## 2016-07-30 LAB — TROPONIN I
Troponin I: 1.98 ng/mL (ref <0.03)
Troponin I: 1.69 ng/mL (ref <0.03)

## 2016-07-30 LAB — ECHOCARDIOGRAM LIMITED
Height: 70 in
Weight: 3160.51 oz

## 2016-07-30 LAB — HEMOGLOBIN A1C
HEMOGLOBIN A1C: 7.6 % — AB (ref 4.8–5.6)
Mean Plasma Glucose: 171 mg/dL

## 2016-07-30 MED ORDER — POTASSIUM PHOSPHATES 15 MMOLE/5ML IV SOLN
30.0000 mmol | Freq: Once | INTRAVENOUS | Status: AC
  Administered 2016-07-30: 30 mmol via INTRAVENOUS
  Filled 2016-07-30: qty 10

## 2016-07-30 MED ORDER — SODIUM CHLORIDE 0.9% FLUSH
10.0000 mL | Freq: Two times a day (BID) | INTRAVENOUS | Status: DC
  Administered 2016-07-30 – 2016-08-02 (×5): 10 mL
  Administered 2016-08-03: 40 mL
  Administered 2016-08-04: 10 mL

## 2016-07-30 MED ORDER — SODIUM CHLORIDE 0.9 % IV BOLUS (SEPSIS)
250.0000 mL | Freq: Once | INTRAVENOUS | Status: AC
  Administered 2016-07-30: 250 mL via INTRAVENOUS

## 2016-07-30 MED ORDER — AMIODARONE IV BOLUS ONLY 150 MG/100ML
150.0000 mg | Freq: Once | INTRAVENOUS | Status: AC
  Administered 2016-07-30: 150 mg via INTRAVENOUS

## 2016-07-30 MED ORDER — CHLORHEXIDINE GLUCONATE CLOTH 2 % EX PADS
6.0000 | MEDICATED_PAD | Freq: Every day | CUTANEOUS | Status: DC
  Administered 2016-07-30 – 2016-08-05 (×6): 6 via TOPICAL

## 2016-07-30 MED ORDER — MAGNESIUM SULFATE 2 GM/50ML IV SOLN: 2.0000 g | Freq: Once | INTRAVENOUS | Status: DC

## 2016-07-30 MED ORDER — MAGNESIUM SULFATE 2 GM/50ML IV SOLN
2.0000 g | Freq: Once | INTRAVENOUS | Status: AC
  Administered 2016-07-30: 2 g via INTRAVENOUS
  Filled 2016-07-30: qty 50

## 2016-07-30 MED ORDER — SODIUM CHLORIDE 0.9 % IV SOLN: INTRAVENOUS | Status: AC

## 2016-07-30 MED ORDER — POTASSIUM CHLORIDE 10 MEQ/50ML IV SOLN
10.0000 meq | INTRAVENOUS | Status: AC
  Administered 2016-07-30 (×6): 10 meq via INTRAVENOUS
  Filled 2016-07-30 (×6): qty 50

## 2016-07-30 MED FILL — Heparin Sodium (Porcine) 2 Unit/ML in Sodium Chloride 0.9%: INTRAMUSCULAR | Qty: 500 | Status: AC

## 2016-07-30 NOTE — Progress Notes (Signed)
Pt urine output 25 ml/hr x 2 hours. Cardiology MD outside room notified. Verbal order for 250 ml fluid bolus obtained.

## 2016-07-30 NOTE — Progress Notes (Signed)
eLink Physician-Brief Progress Note Patient Name: Ashley AkinRichard Harston DOB: 06/18/1945 MRN: [id number]   Date of Service  07/30/2016  HPI/Events of Note  Hypokalemia and moderately low mag earlier in PM.  eICU Interventions  Potassium and mag replaced     Intervention Category Intermediate Interventions: Electrolyte abnormality - evaluation and management  DETERDING,ELIZABETH 07/30/2016, 12:46 AM

## 2016-07-30 NOTE — Progress Notes (Addendum)
Pt. 0000 K+ 3.3 and CVP 7.  Pt. Went into V-Fib arrest at 00:43 for about 18 seconds and was shocked one time to bring pt back to SB rhythm.  Elink notified. Potassium and magnesium replacements ordered by Dr. Darrick Pennaeterding.  Cardiology also notified and ordered 250 mL bolus for low CVP and another 150 mL amiodarone bolus with a continuation of the 60 mg/ hr rate. Will continue to monitor closely.

## 2016-07-30 NOTE — Progress Notes (Signed)
PULMONARY / CRITICAL CARE MEDICINE   Name: Jay AkinRichard Patch MRN: [id number] DOB: 11/15/1945    ADMISSION DATE:  07/09/2016 CONSULTATION DATE:  07/22/2016  REFERRING MD:  Eldridge DaceVaranasi  CHIEF COMPLAINT:  Cardiac Arrest  HISTORY OF PRESENT ILLNESS:  Pt is encephelopathic; therefore, this HPI is obtained from chart review. Jay Livingston is a 71 y.o. male with PMH as outlined below. On morning of 07/14/2016, he was at work sitting at his desk and was found unresponsive with agonal respirations. He was seen roughly 5 minutes prior to this and was normal.  He had 10 minutes of downtime and when EMS arrived, he was found to be in VT without a pulse.  ACLS was performed for 22 minutes before ROSC.  He was brought [facility] ED where he was intubated and taken to the cath lab emergently - found to have 3VD without interventions, IMpella placed  PCCM was asked to assist with vent management.    SUBJECTIVE:  On vent, unresponsive.  VITAL SIGNS: BP (!) 84/74   Pulse (!) 50   Temp (!) 91.4 F (33 C)   Resp 18   Ht 5\' 10"  (1.778 m)   Wt 201 lb 8 oz (91.4 kg)   SpO2 99%   BMI 28.91 kg/m   HEMODYNAMICS: CVP:  [7 mmHg-14 mmHg] 9 mmHg  VENTILATOR SETTINGS: Vent Mode: PRVC FiO2 (%):  [40 %-100 %] 40 % Set Rate:  [18 bmp] 18 bmp Vt Set:  [550 mL] 550 mL PEEP:  [5 cmH20] 5 cmH20 Plateau Pressure:  [16 cmH20-19 cmH20] 17 cmH20  INTAKE / OUTPUT: I/O last 3 completed shifts: In: 4153.2 [I.V.:2995.4; Other:247.8; IV Piggyback:910] Out: 1230 [Urine:1030; Emesis/NG output:200]   PHYSICAL EXAMINATION: General:  WNWDWM heavily sedated HEENT: MM pink/moist, ET ->vent PSY:NMB Neuro: NMB CV: HSSR RRR PULM:decreased bs bases QI:HKVQGI:soft, soft , faint bs Extremities: le cool no pulses Skin: no rashes or lesions   LABS:  BMET  Recent Labs Lab 07/06/2016 2128 07/30/16 0016 07/30/16 0309 07/30/16 0501  NA 139 142 137 138  K 3.5 3.3* 3.2* 3.7  CL 112* 106 111 111  CO2 24  --  19* 21*  BUN 12 14 13 13    CREATININE 0.88 0.80 0.83 0.79  GLUCOSE 148* 157* 170* 150*    Electrolytes  Recent Labs Lab 07/31/2016 1839 07/08/2016 2128 07/30/16 0309 07/30/16 0501  CALCIUM 7.9* 7.8* 7.3* 7.4*  MG 1.8  --   --  2.4  PHOS  --   --   --  1.4*    CBC  Recent Labs Lab 07/25/2016 1105  07/31/2016 2020 07/30/16 0016 07/30/16 0501  WBC 12.2*  --   --   --  12.2*  HGB 13.9  < > 12.2* 11.9* 13.0  HCT 42.6  < > 36.0* 35.0* 38.5*  PLT 166  --   --   --  173  < > = values in this interval not displayed.  Coag's  Recent Labs Lab 07/16/2016 1105 07/05/2016 2128  APTT 31 63*  INR 1.09 1.18    Sepsis Markers No results for input(s): LATICACIDVEN, PROCALCITON, O2SATVEN in the last 168 hours.  ABG  Recent Labs Lab 07/21/2016 1117 07/09/2016 1822  PHART 7.323* 7.467*  PCO2ART 40.1 32.1  PO2ART 110.0* 265.0*    Liver Enzymes  Recent Labs Lab 07/18/2016 1105  AST 127*  ALT 82*  ALKPHOS 55  BILITOT 1.0  ALBUMIN 3.1*    Cardiac Enzymes  Recent Labs Lab 07/12/2016 1839 07/30/16  0309 07/30/16 0501  TROPONINI 2.64* 1.98* 1.69*    Glucose  Recent Labs Lab 07/30/16 0110 07/30/16 0205 07/30/16 0310 07/30/16 0402 07/30/16 0508 07/30/16 0804  GLUCAP 162* 182* 163* 150* 149* 102*    Imaging Dg Abd 1 View  Result Date: August 19, 2016 CLINICAL DATA:  Encounter for NG and ET tube placement EXAM: ABDOMEN - 1 VIEW COMPARISON:  None. FINDINGS: Nasogastric tube extends into the decompressed stomach. Femoral catheter extends across the aortic valve and towards the left ventricular apex. Normal bowel gas pattern. No abnormal abdominal calcifications. The lower abdomen is excluded. Regional bones unremarkable. IMPRESSION: 1. Unremarkable bowel gas pattern. 2. Hardware placement as above. Electronically Signed   By: Corlis Leak M.D.   On: 08-19-2016 13:49   Dg Chest Port 1 View  Result Date: 07/30/2016 CLINICAL DATA:  Left ventricular assist device insertion. EXAM: PORTABLE CHEST 1 VIEW  COMPARISON:  Aug 19, 2016 (multiple examinations) FINDINGS: Grossly unchanged cardiac silhouette and mediastinal contours. Stable positioning of support apparatus including intra-arterial left ventricular assistive device with tip overlying expected location of the left ventricular apex. No pneumothorax. Worsening bibasilar heterogeneous opacities, left greater than right. Suspected trace of sided effusion. No evidence of edema. No acute osseus abnormalities. IMPRESSION: 1. Stable positioning of support apparatus, including intra-arterial left ventricular assistive device. No pneumothorax. 2. Worsening bibasilar opacities, left greater than right, likely atelectasis. Electronically Signed   By: Simonne Come M.D.   On: 07/30/2016 08:16   Dg Chest Port 1 View  Result Date: 08-19-2016 CLINICAL DATA:  Central line placement EXAM: PORTABLE CHEST 1 VIEW COMPARISON:  Portable exam 1612 hours compared to 19-Aug-2016 at 1326 hours FINDINGS: Tip of endotracheal tube projects 2.2 cm above carina. Nasogastric tube extends into stomach. LEFT jugular central venous catheter with tip projecting over confluence of SVC with LEFT brachiocephalic vein. Numerous EKG leads project over chest. Stable heart size and mediastinal contours. Lungs clear. No pleural effusion or pneumothorax. IMPRESSION: No pneumothorax following LEFT jugular line placement. Electronically Signed   By: Ulyses Southward M.D.   On: 2016/08/19 16:37   Dg Chest Port 1 View  Result Date: 08-19-16 CLINICAL DATA:  Encounter for NG and ET tube placement EXAM: PORTABLE CHEST - 1 VIEW COMPARISON:  none FINDINGS: The endotracheal tube tip is approximately 3.5 cm above carina. Nasogastric tube extends at least as far as the stomach, tip not seen. Femoral arterial catheter extends into the ascending aorta. Heart size and mediastinal contours are within normal limits. There is central pulmonary vascular congestion. Relatively low lung volumes. No effusion. Visualized bones  unremarkable. IMPRESSION: 1. Low lung volumes with central pulmonary vascular congestion. 2. Support hardware placement as above. Electronically Signed   By: Corlis Leak M.D.   On: 2016/08/19 13:48     STUDIES:  CXR 6/27 >  Cath 6/27 >  Echo 6/27 > ef 10% EEG 6/27 >   CULTURES: None.  ANTIBIOTICS: None.  SIGNIFICANT EVENTS: 6/27 > admit. 6/27 recurrent VT  LINES/TUBES: ETT 6/27 >  6/27 lij cvl >> Rt femoral impella>> DISCUSSION: 71 y.o. male admitted 6/27 after VT cardiac arrest. Had 10 minutes downtime before ACLS started then 22 minutes prior to ROSC.  Intubated and taken to cath lab emergently. VT again post cath 6/27 and Amio drip  ASSESSMENT / PLAN:  PULMONARY A: Respiratory insufficiency - following VT cardiac arrest. P:   Full vent support- settings as noted Wean as able once off NMB VAP prevention measures. CXR daily  CARDIOVASCULAR A:  VT arrest - s/p emergent cath 6/27 with 3VD s/p impella placement. VT again 6/27 amio drip started Impella per cards P:  Post procedural management per cardiology. Levophed as needed for goal MAP > 80. Assess  Echo as noted Hold preadmission meds.  RENAL Lab Results  Component Value Date   CREATININE 0.79 07/30/2016   CREATININE 0.83 07/30/2016   CREATININE 0.80 07/30/2016    Recent Labs Lab 07/30/16 0016 07/30/16 0309 07/30/16 0501  K 3.3* 3.2* 3.7     A:   Mild hypokalemia Low phos   P:   NS @ 100. Correct electrolytes as indicated. BMP q2hrs x 4 then q4hrs. 6/28 K/Phos repleted. Check mg/K/phos in am  GASTROINTESTINAL A:   GI prophylaxis. Nutrition. P:   SUP: Pantoprazole. NPO.   HEMATOLOGIC A:   VTE Prophylaxis. At risk for hypothermia induced coagulopathy. P:  SCD's. On hep drip  CBC in AM.  INFECTIOUS A:   No indication of infection. P:   Monitor clinically.  ENDOCRINE CBG (last 3)   Recent Labs  07/30/16 0402 07/30/16 0508 07/30/16 0804  GLUCAP 150* 149* 102*      A:   At risk for hypothermia induced hyperglycemia.   P:   ICU hyperglycemia protocol.  NEUROLOGIC A:   Acute encephalopathy - concern for anoxic brain injury. Reported to be posturing prior to being cooled P:   Hypothermia protocol Sedation:  Versed/ fent gtt RASS goal: -5 for duration of NMB. Daily WUA once of NMB Assess EEG.    Family updated: wife  Interdisciplinary Family Meeting v Palliative Care Meeting:  Due by: 7ds  App CC time: 47m  Steve Minor ACNP Adolph Pollack PCCM Pager [phone] till 3 pm If no answer page [phone] 07/30/2016, 9:01 AM

## 2016-07-30 NOTE — Progress Notes (Signed)
Pt urine output continues to be below 30 ml per hour. HF NP notified about urine output and current CVP of 10. Family at bedside. All questions answered. Will continue to monitor.

## 2016-07-30 NOTE — Care Management Note (Signed)
Case Management Note Donn PieriniKristi Alyx Mcguirk RN, BSN Unit 2W-Case Manager--2H coverage [phone]  Patient Details  Name: Jay AkinRichard Livingston MRN: [id number] Date of Birth: 02/28/1945  Subjective/Objective:    Pt admitted s/p cardiac arrest/STEMI- intubated and taken to the cath lab emergently - found to have 3VD without interventions, IMpella placed- hypothermia protocol               Action/Plan: PTA pt lived at home with wife- independent/still working- CM to follow will await to see if pt has neuro recovery  Expected Discharge Date:                  Expected Discharge Plan:     In-House Referral:     Discharge planning Services  CM Consult  Post Acute Care Choice:    Choice offered to:     DME Arranged:    DME Agency:     HH Arranged:    HH Agency:     Status of Service:  In process, will continue to follow  If discussed at Long Length of Stay Meetings, dates discussed:    Additional Comments:  Darrold SpanWebster, Jay Stoffers [place], RN 07/30/2016, 10:10 AM

## 2016-07-30 NOTE — Progress Notes (Signed)
Pt. Went into V-fib arrest at 3:35 and was shocked once.  Ca+ is 7.3 and K+ is 3.2. E-link made aware. Pt. Is still getting K+ runs.  Will monitor closely.

## 2016-07-30 NOTE — Progress Notes (Signed)
eLink Physician-Brief Progress Note Patient Name: Jay AkinRichard Livingston DOB: 08/06/1945 MRN: [id number]   Date of Service  07/30/2016  HPI/Events of Note  hypophos and potassium of 3.7  eICU Interventions  K Phos replacement     Intervention Category Intermediate Interventions: Electrolyte abnormality - evaluation and management  [name] 07/30/2016, 6:14 AM

## 2016-07-30 NOTE — Progress Notes (Signed)
ANTICOAGULATION CONSULT NOTE  Pharmacy Consult for heparin Indication: Impella CP  No Known Allergies  Patient Measurements: Height: 5' 10" (177.8 cm) Weight: 201 lb 8 oz (91.4 kg) IBW/kg (Calculated) : 73 Heparin Dosing Weight: 89.6 kg  Vital Signs: Temp: 90.1 F (32.3 C) (06/28 1200) Temp Source: Core (Comment) (06/28 1200) BP: 97/78 (06/28 1200) Pulse Rate: 50 (06/28 1200)  Labs:  Recent Labs  07/18/2016 1105  07/22/2016 1839  07/27/2016 2128 07/30/16 0016 07/30/16 0309 07/30/16 0501 07/30/16 0818 07/30/16 0839 07/30/16 1130  HGB 13.9  < >  --   < >  --  11.9*  --  13.0 16.0 12.9* 12.6*  HCT 42.6  < >  --   < >  --  35.0*  --  38.5* 47.0 38.0* 37.0*  PLT 166  --   --   --   --   --   --  173  --   --   --   APTT 31  --   --   --  63*  --   --   --   --   --   --   LABPROT 14.1  --   --   --  15.1  --   --   --   --   --   --   INR 1.09  --   --   --  1.18  --   --   --   --   --   --   CREATININE 1.14  < > 0.97  < > 0.88 0.80 0.83 0.79  --   --   --   TROPONINI 0.07*  --  2.64*  --   --   --  1.98* 1.69*  --   --   --   < > = values in this interval not displayed.  Estimated Creatinine Clearance: 97.7 mL/min (by C-G formula based on SCr of 0.79 mg/dL).   Medical History: Past Medical History:  Diagnosis Date  . Medical history non-contributory    per family does not see a doctor, is not on any medications    Medications:  Scheduled:  . artificial tears  1 application Both Eyes Q8H  . aspirin  81 mg Oral Daily  . chlorhexidine gluconate (MEDLINE KIT)  15 mL Mouth Rinse BID  . Chlorhexidine Gluconate Cloth  6 each Topical Daily  . fentaNYL (SUBLIMAZE) injection  50 mcg Intravenous Once  . mouth rinse  15 mL Mouth Rinse 10 times per day  . midazolam  1 mg Intravenous Once  . mupirocin ointment  1 application Nasal BID  . pantoprazole (PROTONIX) IV  40 mg Intravenous QHS  . sodium chloride flush  10-40 mL Intracatheter Q12H    [name]  admitted s/p VF arrest, STEMI.  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  Awaiting CABG consult eventually once more stable.  Currently on systemic heparin at 700 units/hr + purge solution (550 units/hr).    Goal of Therapy:  ACTs at goal Monitor platelets by anticoagulation protocol: Yes   Plan:  1. Heparin at 50 units/ml through Impella purge solution. 2. Systemic IV heparin to be titrated by RN as needed to maintain ACTs at goal. 3. Pharmacy will continue to monitor peripherally to assist with titration as necessary.  Jessica Carney, Pharm D, BCPS  Clinical Pharmacist Pager [phone]  07/30/2016 12:48 PM    

## 2016-07-30 NOTE — Consult Note (Signed)
Advanced Heart Failure Team Consult Note   Primary Physician:None  Primary Cardiologist: None   Reason for Consultation: Cardiogenic Shock   HPI:    Jay Livingston is seen today for evaluation of cardiogenic shock at the request of Dr Irish Lack. Patient is intubated. Therefore history has been obtained from chart review.   Jay Livingston is a 71 year old with no known medical history admitted after cardiac arrest.Yesterday he was at work and found unresponsive with agonal breathing. He was down for approximately 10 minutes before EMS arrived. On arrival he was in VT. He had 22 minutes ACLS before ROSC. In the ED was intubated and taken urgently to cath lab with 3 vessel disease. Impella placed and he was started on hypothermia protocol. Troponin trendi 0.07>2.64>1.98. He remains on norepi 13-18 mcg and amio drip. Earlier this morning had VF requiring 2 shocks. Amio was increased to 60 mg per hour.    LHC 07/04/2016   Prox Cx lesion, 80 %stenosed.  Ost LAD to Prox LAD lesion, 100 %stenosed. 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  Dist RCA lesion, 80 %stenosed.  Mid RCA lesion, 50 %stenosed.  LV end diastolic pressure is severely elevated.  There is no aortic valve stenosis.  Continue supportive care with Impella and hypothermia protocol.    ECHO 07/08/2016  Left ventricle: Impella catheter crosses aortic valve and enters   left ventricle. Impella position looks deep, 59 mm. The cavity   size was normal. Wall thickness was normal. The estimated   ejection fraction was 10%. Diffuse hypokinesis. Features are   consistent with a pseudonormal left ventricular filling pattern,   with concomitant abnormal relaxation and increased filling   pressure (grade 2 diastolic dysfunction). - Aortic valve: Cannot assess, Impella in place. - Mitral valve: Mildly calcified annulus. There was trivial   regurgitation. - Left atrium: The atrium was mildly dilated. - Right ventricle: The cavity  size was normal. Systolic function   was mildly to moderately reduced. - Right atrium: The atrium was mildly dilated. - Tricuspid valve: No complete TR doppler jet so unable to estimate   PA systolic pressure. - Systemic veins: IVC measured 2.1 cm with minimal respirophasic   variation, suggesting RA pressure 15 mmHg.  Review of Systems: [y] = yes, _0  = no Patient is encephalopathic and or intubated. Therefore history has been obtained from chart review.   General: Weight gain _1 ; Weight loss _2 ; Anorexia _3 ; Fatigue _4 ; Fever _5 ; Chills _6 ; Weakness _7   Cardiac: Chest pain/pressure _8 ; Resting SOB _9 ; Exertional SOB _10 ; Orthopnea _11 ; Pedal Edema _12 ; Palpitations _13 ; Syncope _14 ; Presyncope _15 ; Paroxysmal nocturnal dyspnea_16   Pulmonary: Cough _17 ; Wheezing_18 ; Hemoptysis_19 ; Sputum _20 ; Snoring _21   GI: Vomiting_22 ; Dysphagia_23 ; Melena_24 ; Hematochezia _25 ; Heartburn_26 ; Abdominal pain _27 ; Constipation _28 ; Diarrhea _29 ; BRBPR _30   GU: Hematuria_31 ; Dysuria _32 ; Nocturia_33   Vascular: Pain in legs with walking _34 ; Pain in feet with lying flat _35 ; Non-healing sores _36 ; Stroke _37 ; TIA _38 ; Slurred speech _39 ;  Neuro: Headaches_40 ; Vertigo_41 ; Seizures_42 ; Paresthesias_43 ;Blurred vision _44 ; Diplopia _45 ; Vision changes _46   Ortho/Skin: Arthritis _47 ; Joint pain _48 ; Muscle pain _49 ; Joint swelling _50 ; Back Pain _51 ; Rash _52   Psych: Depression_53 ;  Anxiety_0   Heme: Bleeding problems _1 ; Clotting disorders _2 ; Anemia _3   Endocrine: Diabetes _4 ; Thyroid dysfunction_5   Home Medications Prior to Admission medications   Not on File    Past Medical History: Past Medical History:  Diagnosis Date  . Medical history non-contributory    per family does not see a doctor, is not on any medications    Past Surgical History: Past Surgical History:  Procedure Laterality Date  . LEFT HEART CATH AND CORONARY ANGIOGRAPHY N/A 07/03/2016   Procedure: Left Heart Cath and  Coronary Angiography;  Surgeon: Jettie Booze, MD;  Location: [facility];  Service: Cardiovascular;  Laterality: N/A;  . VENTRICULAR ASSIST DEVICE INSERTION N/A 07/12/2016   Procedure: Ventricular Assist Device Insertion;  Surgeon: Jettie Booze, MD;  Location: [facility];  Service: Cardiovascular;  Laterality: N/A;    Family History: History reviewed. No pertinent family history. No family history of heart disease  Social History: Social History   Social History  . Marital status: Single    Spouse name: N/A  . Number of children: N/A  . Years of education: N/A   Social History Main Topics  . Smoking status: Former Smoker    Types: Cigarettes    Quit date: 07/30/2010  . Smokeless tobacco: Never Used  . Alcohol use 29.4 oz/week    42 Cans of beer, 7 Shots of liquor per week     Comment: per family report  . Drug use: No  . Sexual activity: Not Currently   Other Topics Concern  . None   Social History Narrative  . None    Allergies:  No Known Allergies  Objective:    Vital Signs:   Temp:  [90.1 F (32.3 C)-99.3 F (37.4 C)] 91.4 F (33 C) (06/28 0700) Pulse Rate:  [0-137] 52 (06/28 0700) Resp:  [0-86] 18 (06/28 0700) BP: (97-186)/(80-126) 112/88 (06/28 0700) SpO2:  [0 %-100 %] 99 % (06/28 0700) Arterial Line BP: (139-173)/(84-112) 142/92 (06/27 1630) FiO2 (%):  [40 %-100 %] 40 % (06/28 0400) Weight:  [197 lb 8.5 oz (89.6 kg)-201 lb 8 oz (91.4 kg)] 201 lb 8 oz (91.4 kg) (06/28 0500) Last BM Date:  (pta)  Weight change: Filed Weights   07/13/2016 1241 07/30/16 0500  Weight: 197 lb 8.5 oz (89.6 kg) 201 lb 8 oz (91.4 kg)    Intake/Output:   Intake/Output Summary (Last 24 hours) at 07/30/16 0726 Last data filed at 07/30/16 0700  Gross per 24 hour  Intake          4153.18 ml  Output             1230 ml  Net          2923.18 ml      Physical Exam   CVP 7 General:  Intubated sedated HEENT: normal Neck: supple. JVP . Carotids  2+ bilat; no bruits. No lymphadenopathy or thyromegaly appreciated. LIJ Cor: PMI nondisplaced. Irregular rate & rhythm. No rubs, gallops or murmurs. Zoll Pad on.  Lungs: mechanical breath sounds Abdomen: soft, nontender, nondistended. No hepatosplenomegaly. No bruits or masses. Good bowel sounds. Extremities: no cyanosis, clubbing, rash, edema R groin impella ecchymotic around insertion site.  Neuro: intubated sedated.  GU: Foley yellow urine.      Telemetry  Sinus Loletha Grayer with NSVT. 50s   EKG   NSR 96 bpm   Labs   Basic Metabolic Panel:  Recent Labs Lab 07/10/2016 1105  07/10/2016 1839 07/23/2016 2020 07/21/2016 2128 07/30/16 0016 07/30/16 0309 07/30/16 0501  NA 138  < > 138 142 139 142 137 138  K 3.5  < > 3.2* 3.2* 3.5 3.3* 3.2* 3.7  CL 108  < > 111 107 112* 106 111 111  CO2 21*  --  22  --  24  --  19* 21*  GLUCOSE 346*  < > 155* 147* 148* 157* 170* 150*  BUN 10  < > _0 CREATININE 1.14  < > 0.97 0.80 0.88 0.80 0.83 0.79  CALCIUM 8.0*  --  7.9*  --  7.8*  --  7.3* 7.4*  MG  --   --  1.8  --   --   --   --  2.4  PHOS  --   --   --   --   --   --   --  1.4*  < > = values in this interval not displayed.  Liver Function Tests:  Recent Labs Lab 07/09/2016 1105  AST 127*  ALT 82*  ALKPHOS 55  BILITOT 1.0  PROT 6.0*  ALBUMIN 3.1*   No results for input(s): LIPASE, AMYLASE in the last 168 hours. No results for input(s): AMMONIA in the last 168 hours.  CBC:  Recent Labs Lab 07/28/2016 1105  07/22/2016 1555 07/16/2016 1804 07/05/2016 2020 07/30/16 0016 07/30/16 0501  WBC 12.2*  --   --   --   --   --  12.2*  HGB 13.9  < > 13.9 12.6* 12.2* 11.9* 13.0  HCT 42.6  < > 41.0 37.0* 36.0* 35.0* 38.5*  MCV 93.6  --   --   --   --   --  90.0  PLT 166  --   --   --   --   --  173  < > = values in this interval not displayed.  Cardiac Enzymes:  Recent Labs Lab 07/17/2016 1105 07/25/2016 1839 07/30/16 0309 07/30/16 0501  TROPONINI 0.07* 2.64* 1.98* 1.69*     BNP: BNP (last 3 results) No results for input(s): BNP in the last 8760 hours.  ProBNP (last 3 results) No results for input(s): PROBNP in the last 8760 hours.   CBG:  Recent Labs Lab 07/30/16 0110 07/30/16 0205 07/30/16 0310 07/30/16 0402 07/30/16 0508  GLUCAP 162* 182* 163* 150* 149*    Coagulation Studies:  Recent Labs  07/05/2016 1105 07/17/2016 2128  LABPROT 14.1 15.1  INR 1.09 1.18     Imaging   Dg Abd 1 View  Result Date: 07/30/2016 CLINICAL DATA:  Encounter for NG and ET tube placement EXAM: ABDOMEN - 1 VIEW COMPARISON:  None. FINDINGS: Nasogastric tube extends into the decompressed stomach. Femoral catheter extends across the aortic valve and towards the left ventricular apex. Normal bowel gas pattern. No abnormal abdominal calcifications. The lower abdomen is excluded. Regional bones unremarkable. IMPRESSION: 1. Unremarkable bowel gas pattern. 2. Hardware placement as above. Electronically Signed   By: Lucrezia Europe M.D.   On: 07/24/2016 13:49   Dg Chest Port 1 View  Result Date: 07/22/2016 CLINICAL DATA:  Central line placement EXAM: PORTABLE CHEST 1 VIEW COMPARISON:  Portable exam 1612 hours compared to 07/21/2016 at 1326 hours FINDINGS: Tip of endotracheal tube projects 2.2 cm above carina. Nasogastric tube extends into stomach. LEFT jugular central venous catheter with tip projecting over confluence of SVC with LEFT brachiocephalic vein. Numerous EKG leads project over chest. Stable heart size  and mediastinal contours. Lungs clear. No pleural effusion or pneumothorax. IMPRESSION: No pneumothorax following LEFT jugular line placement. Electronically Signed   By: Lavonia Dana M.D.   On: 07/19/2016 16:37   Dg Chest Port 1 View  Result Date: 07/13/2016 CLINICAL DATA:  Encounter for NG and ET tube placement EXAM: PORTABLE CHEST - 1 VIEW COMPARISON:  none FINDINGS: The endotracheal tube tip is approximately 3.5 cm above carina. Nasogastric tube extends at least as far  as the stomach, tip not seen. Femoral arterial catheter extends into the ascending aorta. Heart size and mediastinal contours are within normal limits. There is central pulmonary vascular congestion. Relatively low lung volumes. No effusion. Visualized bones unremarkable. IMPRESSION: 1. Low lung volumes with central pulmonary vascular congestion. 2. Support hardware placement as above. Electronically Signed   By: Lucrezia Europe M.D.   On: 07/07/2016 13:48      Medications:     Current Medications: . artificial tears  1 application Both Eyes F7P  . aspirin  81 mg Oral Daily  . chlorhexidine gluconate (MEDLINE KIT)  15 mL Mouth Rinse BID  . Chlorhexidine Gluconate Cloth  6 each Topical Daily  . fentaNYL (SUBLIMAZE) injection  50 mcg Intravenous Once  . mouth rinse  15 mL Mouth Rinse 10 times per day  . midazolam  1 mg Intravenous Once  . mupirocin ointment  1 application Nasal BID  . pantoprazole (PROTONIX) IV  40 mg Intravenous QHS  . sodium chloride flush  3 mL Intravenous Q12H     Infusions: . sodium chloride Stopped (07/30/16 0000)  . sodium chloride 50 mL/hr at 07/22/2016 2044  . amiodarone 60 mg/hr (07/30/16 0216)  . cisatracurium (NIMBEX) infusion 1.5 mcg/kg/min (07/30/16 0620)  . fentaNYL infusion INTRAVENOUS 300 mcg/hr (07/30/16 0215)  . impella catheter heparin 50 unit/mL in dextrose 5%    . heparin 700 Units/hr (07/30/16 0300)  . insulin (NOVOLIN-R) infusion 2.8 Units/hr (07/30/16 0700)  . midazolam (VERSED) infusion 4 mg/hr (07/30/16 0204)  . norepinephrine (LEVOPHED) Adult infusion 13 mcg/min (07/30/16 0641)  . potassium phosphate IVPB (mmol) 30 mmol (07/30/16 1025)       Patient Profile  Jay Livingston is a 22 year with no past medical history admitted with cardiac arrest and STEMI.    Assessment/Plan   1. Cardiac Arrest/STEMI- LHC with 3 vessel disease. On aspirin Hypothermia Protocol.  2. Cardiogenic Shock  Impella 2.5 in place. CVP 7-8 . Give 250 bolus would aim to  keep CVP 8-10. Continue norepi 13 mcg. Check CO-OX now.  Renal function ok.  3. Acute Respiratory Failure- Intubated . CCM managing.  4.  Acute Systolic Heart Failure- ECHO with severely reduced EF. Continue impella. As above on norepi. Does not need diuresis. No bb with shock. 5. VT- 6/28 with shock x2. Continue amio drip at 60 mg per hour.  6. CAD- 3 vessel disease. If recovers will need cardiac surgery to consult.     Length of Stay: 1  Amy Clegg, NP  07/30/2016, 7:26 AM  Advanced Heart Failure Team Pager [phone] (M-F; 7a - 4p)  Please contact [facility] for night-coverage after hours (4p -7a ) and weekends on [email or web address]  Agree.  Cath images reviewed personally. Bedside echo done personally. LVEF 15%. RV ok. Impella placement looks good 3.6 cm from AoV. Remains on P-6. Good waveforms. On norepi 18. CVP 6-8. Co-ox 69%. Trop 2.5  EEG: diffuse slowing   On exam continues on cooling process.  Intubated/sedated.  Cor RRR Lungs  clear Ab soft + cooling pads Ext cool no edema  Neuro intubated and sedated  Remains critically ill. Primary event likely VF arrest in setting of undiagnosed ischemic cardiomyopathy with severe underlying CAD.   Hemodynamics stable on Impella at P-6. Will continue current support,. Will be rewarmed at 2a.   Await to see if he has neuro recovery.  Titrate pressors and add milrinone as needed.   VAD adjusted personally at bedside.   CRITICAL CARE Performed by: Glori Bickers  Total critical care time: 45 minutes  Critical care time was exclusive of separately billable procedures and treating other patients.  Critical care was necessary to treat or prevent imminent or life-threatening deterioration.  Critical care was time spent personally by me (independent of midlevel providers or residents) on the following activities: development of treatment plan with patient and/or surrogate as well as nursing, discussions with consultants, evaluation of  patient's response to treatment, examination of patient, obtaining history from patient or surrogate, ordering and performing treatments and interventions, ordering and review of laboratory studies, ordering and review of radiographic studies, pulse oximetry and re-evaluation of patient's condition.  Glori Bickers, MD  9:46 AM

## 2016-07-31 ENCOUNTER — Inpatient Hospital Stay (HOSPITAL_COMMUNITY): Payer: Medicare Other

## 2016-07-31 DIAGNOSIS — I5021 Acute systolic (congestive) heart failure: Secondary | ICD-10-CM

## 2016-07-31 LAB — GLUCOSE, CAPILLARY
GLUCOSE-CAPILLARY: 124 mg/dL — AB (ref 65–99)
GLUCOSE-CAPILLARY: 142 mg/dL — AB (ref 65–99)
GLUCOSE-CAPILLARY: 147 mg/dL — AB (ref 65–99)
GLUCOSE-CAPILLARY: 151 mg/dL — AB (ref 65–99)
GLUCOSE-CAPILLARY: 139 mg/dL — AB (ref 65–99)
GLUCOSE-CAPILLARY: 141 mg/dL — AB (ref 65–99)
GLUCOSE-CAPILLARY: 129 mg/dL — AB (ref 65–99)
GLUCOSE-CAPILLARY: 141 mg/dL — AB (ref 65–99)
GLUCOSE-CAPILLARY: 156 mg/dL — AB (ref 65–99)
GLUCOSE-CAPILLARY: 131 mg/dL — AB (ref 65–99)
Glucose-Capillary: 133 mg/dL — ABNORMAL HIGH (ref 65–99)
Glucose-Capillary: 142 mg/dL — ABNORMAL HIGH (ref 65–99)
Glucose-Capillary: 156 mg/dL — ABNORMAL HIGH (ref 65–99)
Glucose-Capillary: 148 mg/dL — ABNORMAL HIGH (ref 65–99)
Glucose-Capillary: 151 mg/dL — ABNORMAL HIGH (ref 65–99)
Glucose-Capillary: 144 mg/dL — ABNORMAL HIGH (ref 65–99)
Glucose-Capillary: 150 mg/dL — ABNORMAL HIGH (ref 65–99)
Glucose-Capillary: 150 mg/dL — ABNORMAL HIGH (ref 65–99)
Glucose-Capillary: 158 mg/dL — ABNORMAL HIGH (ref 65–99)
Glucose-Capillary: 168 mg/dL — ABNORMAL HIGH (ref 65–99)

## 2016-07-31 LAB — URINALYSIS, ROUTINE W REFLEX MICROSCOPIC
GLUCOSE, UA: NEGATIVE mg/dL
Ketones, ur: NEGATIVE mg/dL
NITRITE: NEGATIVE
PH: 5.5 (ref 5.0–8.0)
Protein, ur: 100 mg/dL — AB

## 2016-07-31 LAB — POCT ACTIVATED CLOTTING TIME
ACTIVATED CLOTTING TIME: 158 s
ACTIVATED CLOTTING TIME: 147 s
Activated Clotting Time: 180 seconds
Activated Clotting Time: 180 seconds
Activated Clotting Time: 180 seconds
Activated Clotting Time: 180 seconds
Activated Clotting Time: 186 seconds

## 2016-07-31 LAB — BASIC METABOLIC PANEL
ANION GAP: 7 (ref 5–15)
ANION GAP: 8 (ref 5–15)
Anion gap: 7 (ref 5–15)
BUN: 13 mg/dL (ref 6–20)
BUN: 13 mg/dL (ref 6–20)
BUN: 16 mg/dL (ref 6–20)
CALCIUM: 6.5 mg/dL — AB (ref 8.9–10.3)
CALCIUM: 6.7 mg/dL — AB (ref 8.9–10.3)
CHLORIDE: 101 mmol/L (ref 101–111)
CO2: 18 mmol/L — AB (ref 22–32)
CO2: 18 mmol/L — ABNORMAL LOW (ref 22–32)
CO2: 19 mmol/L — AB (ref 22–32)
CREATININE: 1.24 mg/dL (ref 0.61–1.24)
CREATININE: 1.32 mg/dL — AB (ref 0.61–1.24)
Calcium: 6.5 mg/dL — ABNORMAL LOW (ref 8.9–10.3)
Chloride: 105 mmol/L (ref 101–111)
Chloride: 105 mmol/L (ref 101–111)
Creatinine, Ser: 1.57 mg/dL — ABNORMAL HIGH (ref 0.61–1.24)
GFR calc Af Amer: >60 mL/min (ref >60)
GFR calc Af Amer: 50 mL/min — ABNORMAL LOW (ref >60)
GFR calc non Af Amer: 53 mL/min — ABNORMAL LOW (ref >60)
GFR calc non Af Amer: 43 mL/min — ABNORMAL LOW (ref >60)
GFR, EST NON AFRICAN AMERICAN: 57 mL/min — AB (ref >60)
GLUCOSE: 156 mg/dL — AB (ref 65–99)
GLUCOSE: 166 mg/dL — AB (ref 65–99)
Glucose, Bld: 144 mg/dL — ABNORMAL HIGH (ref 65–99)
POTASSIUM: 5.2 mmol/L — AB (ref 3.5–5.1)
Potassium: 5.8 mmol/L — ABNORMAL HIGH (ref 3.5–5.1)
Potassium: 5.3 mmol/L — ABNORMAL HIGH (ref 3.5–5.1)
SODIUM: 130 mmol/L — AB (ref 135–145)
Sodium: 130 mmol/L — ABNORMAL LOW (ref 135–145)
Sodium: 128 mmol/L — ABNORMAL LOW (ref 135–145)

## 2016-07-31 LAB — POCT I-STAT 4, (NA,K, GLUC, HGB,HCT)
Glucose, Bld: 139 mg/dL — ABNORMAL HIGH (ref 65–99)
HEMATOCRIT: 40 % (ref 39.0–52.0)
HEMOGLOBIN: 13.6 g/dL (ref 13.0–17.0)
Potassium: 4.9 mmol/L (ref 3.5–5.1)
Sodium: 134 mmol/L — ABNORMAL LOW (ref 135–145)

## 2016-07-31 LAB — COOXEMETRY PANEL
CARBOXYHEMOGLOBIN: 0.8 % (ref 0.5–1.5)
CARBOXYHEMOGLOBIN: 0.8 % (ref 0.5–1.5)
Carboxyhemoglobin: 0.9 % (ref 0.5–1.5)
METHEMOGLOBIN: 1.2 % (ref 0.0–1.5)
METHEMOGLOBIN: 1.3 % (ref 0.0–1.5)
Methemoglobin: 1.2 % (ref 0.0–1.5)
O2 SAT: 76.3 %
O2 SAT: 73.3 %
O2 Saturation: 66.6 %
TOTAL HEMOGLOBIN: 14 g/dL (ref 12.0–16.0)
TOTAL HEMOGLOBIN: 11.4 g/dL — AB (ref 12.0–16.0)
Total hemoglobin: 11.8 g/dL — ABNORMAL LOW (ref 12.0–16.0)

## 2016-07-31 LAB — CBC
HCT: 39.9 % (ref 39.0–52.0)
HEMATOCRIT: 33.7 % — AB (ref 39.0–52.0)
HEMOGLOBIN: 11.2 g/dL — AB (ref 13.0–17.0)
Hemoglobin: 13.4 g/dL (ref 13.0–17.0)
MCH: 30.7 pg (ref 26.0–34.0)
MCH: 30.4 pg (ref 26.0–34.0)
MCHC: 33.6 g/dL (ref 30.0–36.0)
MCHC: 33.2 g/dL (ref 30.0–36.0)
MCV: 91.5 fL (ref 78.0–100.0)
MCV: 91.3 fL (ref 78.0–100.0)
PLATELETS: 166 10*3/uL (ref 150–400)
Platelets: 144 10*3/uL — ABNORMAL LOW (ref 150–400)
RBC: 4.36 MIL/uL (ref 4.22–5.81)
RBC: 3.69 MIL/uL — AB (ref 4.22–5.81)
RDW: 13.7 % (ref 11.5–15.5)
RDW: 13.8 % (ref 11.5–15.5)
WBC: 21.6 10*3/uL — ABNORMAL HIGH (ref 4.0–10.5)
WBC: 18.6 10*3/uL — AB (ref 4.0–10.5)

## 2016-07-31 LAB — HEPATIC FUNCTION PANEL
ALT: 61 U/L (ref 17–63)
AST: 75 U/L — AB (ref 15–41)
Albumin: 2.4 g/dL — ABNORMAL LOW (ref 3.5–5.0)
Alkaline Phosphatase: 46 U/L (ref 38–126)
BILIRUBIN DIRECT: 0.5 mg/dL (ref 0.1–0.5)
BILIRUBIN TOTAL: 1.2 mg/dL (ref 0.3–1.2)
Indirect Bilirubin: 0.7 mg/dL (ref 0.3–0.9)
Total Protein: 5.3 g/dL — ABNORMAL LOW (ref 6.5–8.1)

## 2016-07-31 LAB — ALT: ALT: 70 U/L — AB (ref 17–63)

## 2016-07-31 LAB — PHOSPHORUS: PHOSPHORUS: 4.8 mg/dL — AB (ref 2.5–4.6)

## 2016-07-31 LAB — AST: AST: 85 U/L — AB (ref 15–41)

## 2016-07-31 LAB — MAGNESIUM: MAGNESIUM: 1.7 mg/dL (ref 1.7–2.4)

## 2016-07-31 MED ORDER — VITAL HIGH PROTEIN PO LIQD
1000.0000 mL | ORAL | Status: DC
  Administered 2016-07-31: 1000 mL
  Administered 2016-08-01: 15:00:00
  Administered 2016-08-01: 1000 mL
  Administered 2016-08-01 (×2)
  Administered 2016-08-01: 1000 mL
  Administered 2016-08-02 – 2016-08-05 (×3)

## 2016-07-31 MED ORDER — SODIUM POLYSTYRENE SULFONATE 15 GM/60ML PO SUSP
30.0000 g | Freq: Once | ORAL | Status: AC
  Administered 2016-07-31: 30 g
  Filled 2016-07-31: qty 120

## 2016-07-31 MED ORDER — PRO-STAT SUGAR FREE PO LIQD
30.0000 mL | Freq: Every day | ORAL | Status: DC
  Administered 2016-07-31 – 2016-08-04 (×14): 30 mL
  Filled 2016-07-31 (×15): qty 30

## 2016-07-31 MED ORDER — INSULIN ASPART 100 UNIT/ML ~~LOC~~ SOLN
2.0000 [IU] | SUBCUTANEOUS | Status: DC
  Administered 2016-07-31: 4 [IU] via SUBCUTANEOUS
  Administered 2016-07-31 – 2016-08-01 (×3): 2 [IU] via SUBCUTANEOUS
  Administered 2016-08-01: 4 [IU] via SUBCUTANEOUS
  Administered 2016-08-01: 6 [IU] via SUBCUTANEOUS
  Administered 2016-08-01 (×2): 4 [IU] via SUBCUTANEOUS

## 2016-07-31 MED ORDER — FENTANYL CITRATE (PF) 100 MCG/2ML IJ SOLN
25.0000 ug | INTRAMUSCULAR | Status: DC | PRN
  Administered 2016-07-31 – 2016-08-04 (×4): 50 ug via INTRAVENOUS
  Filled 2016-07-31 (×2): qty 2

## 2016-07-31 MED ORDER — MILRINONE LACTATE IN DEXTROSE 20-5 MG/100ML-% IV SOLN
0.1250 ug/kg/min | INTRAVENOUS | Status: DC
Start: 2016-07-31 — End: 2016-08-05
  Administered 2016-07-31 – 2016-08-01 (×2): 0.125 ug/kg/min via INTRAVENOUS
  Administered 2016-08-02 – 2016-08-03 (×6): 0.5 ug/kg/min via INTRAVENOUS
  Administered 2016-08-03 – 2016-08-04 (×2): 0.25 ug/kg/min via INTRAVENOUS
  Filled 2016-07-31 (×10): qty 100

## 2016-07-31 MED ORDER — FUROSEMIDE 10 MG/ML IJ SOLN
80.0000 mg | Freq: Once | INTRAMUSCULAR | Status: AC
  Administered 2016-07-31: 80 mg via INTRAVENOUS
  Filled 2016-07-31: qty 8

## 2016-07-31 MED ORDER — FUROSEMIDE 10 MG/ML IJ SOLN
40.0000 mg | Freq: Once | INTRAMUSCULAR | Status: AC
  Administered 2016-07-31: 40 mg via INTRAVENOUS
  Filled 2016-07-31: qty 4

## 2016-07-31 MED ORDER — INSULIN GLARGINE 100 UNIT/ML ~~LOC~~ SOLN
10.0000 [IU] | Freq: Every day | SUBCUTANEOUS | Status: DC
  Administered 2016-07-31 – 2016-08-05 (×6): 10 [IU] via SUBCUTANEOUS
  Filled 2016-07-31 (×7): qty 0.1

## 2016-07-31 MED ORDER — INSULIN ASPART 100 UNIT/ML ~~LOC~~ SOLN
1.0000 [IU] | SUBCUTANEOUS | Status: DC
  Administered 2016-07-31 – 2016-08-03 (×11): 1 [IU] via SUBCUTANEOUS

## 2016-07-31 MED ORDER — FENTANYL 2500MCG IN NS 250ML (10MCG/ML) PREMIX INFUSION
10.0000 ug/h | INTRAVENOUS | Status: DC
  Administered 2016-07-31: 25 ug/h via INTRAVENOUS
  Administered 2016-08-01: 200 ug/h via INTRAVENOUS
  Administered 2016-08-01: 175 ug/h via INTRAVENOUS
  Administered 2016-08-02: 250 ug/h via INTRAVENOUS
  Administered 2016-08-02: 200 ug/h via INTRAVENOUS
  Administered 2016-08-03 – 2016-08-05 (×5): 300 ug/h via INTRAVENOUS
  Administered 2016-08-05: 200 ug/h via INTRAVENOUS
  Filled 2016-07-31 (×12): qty 250

## 2016-07-31 MED ORDER — MIDAZOLAM HCL 2 MG/2ML IJ SOLN
1.0000 mg | INTRAMUSCULAR | Status: DC | PRN
  Administered 2016-07-31 – 2016-08-02 (×8): 2 mg via INTRAVENOUS
  Administered 2016-08-03: 1 mg via INTRAVENOUS
  Administered 2016-08-04: 2 mg via INTRAVENOUS
  Administered 2016-08-04: 1 mg via INTRAVENOUS
  Filled 2016-07-31 (×8): qty 2

## 2016-07-31 MED ORDER — MAGNESIUM SULFATE 2 GM/50ML IV SOLN
2.0000 g | Freq: Once | INTRAVENOUS | Status: AC
  Administered 2016-07-31: 2 g via INTRAVENOUS
  Filled 2016-07-31: qty 50

## 2016-07-31 MED ORDER — DEXTROSE 10 % IV SOLN: INTRAVENOUS | Status: DC | PRN

## 2016-07-31 NOTE — Progress Notes (Signed)
Per Dr. Gala RomneyBensimhon, OK to leave femostop in place with 0 pressure in dome until 2300. Will continue to monitor closely.  Herma ArdMOSELEY, Bryler Dibble F, RN

## 2016-07-31 NOTE — Progress Notes (Addendum)
Initial Nutrition Assessment  DOCUMENTATION CODES:   Obesity unspecified  INTERVENTION:    Initiate TF via OGT with Vital HP at goal rate of 40 ml/h (960 ml per day) and Prostat 30 ml 5 times daily   TF regimen to provide 1460 kcals, 159 gm protein, 803 ml free water daily  NUTRITION DIAGNOSIS:   Inadequate oral intake related to inability to eat as evidenced by NPO status  GOAL:   Provide needs based on ASPEN/SCCM guidelines  MONITOR:   Vent status, TF tolerance, Labs, Weight trends, I & O's  REASON FOR ASSESSMENT:   Consult Enteral/tube feeding initiation and management  ASSESSMENT:   "71 y.o. male with PMH as outlined below. On morning of 07/06/2016, he was at work sitting at his desk and was found unresponsive with agonal respirations. He was seen roughly 5 minutes prior to this and was normal.  He had 10 minutes of downtime and when EMS arrived, he was found to be in VT without a pulse.  ACLS was performed for 22 minutes before ROSC." Copied from PCCM note, Jay Livingston, Praveen, MD, 07/31/16.  Patient is currently intubated on ventilator support Temp (24hrs), Avg:95.7 F (35.4 C), Min:90.1 F (32.3 C), Max:99.7 F (37.6 C)  OGT in place  Pt s/p cardiac arrest, ST elevation.  Emergent cath showed 3 vessel disease. Impella P6 placed. Off hypothermia protocol. Pt awake, follows commands. Labs and medications reviewed. Na 130 (L). K 5.8 (H). CBG's [phone].  Nutrition focused physical exam completed.  No muscle or subcutaneous fat depletion noticed.  Diet Order:  Diet NPO time specified  Skin:  Reviewed, no issues  Last BM:  PTA  Height:   Ht Readings from Last 1 Encounters:  07/04/2016 5\' 10"  (1.778 m)   Weight:   Wt Readings from Last 1 Encounters:  07/31/16 216 lb 0.8 oz (98 kg)   Ideal Body Weight:  75.4 kg  BMI:  Body mass index is 31 kg/m.  Estimated Nutritional Needs:   Kcal:  [id number]  Protein:  >/= 150 gm  Fluid:  per MD  EDUCATION  NEEDS:   No education needs identified at this time  Maureen ChattersKatie Jeffie Spivack, RD, LDN Pager #: [phone] After-Hours Pager #: [phone]

## 2016-07-31 NOTE — Progress Notes (Signed)
Sputum sample obtained and sent down to main lab without complications.  

## 2016-07-31 NOTE — Progress Notes (Signed)
Wasted 60 mL of Versed (one whole bag and 10 of another bag) and 50 mL of Fentanyl in sink with Minerva AreolaEric, Charity fundraiserN.

## 2016-07-31 NOTE — Progress Notes (Signed)
PULMONARY / CRITICAL CARE MEDICINE   Name: Jay Livingston MRN: [id number] DOB: 1945/12/01    ADMISSION DATE:  07/03/2016 CONSULTATION DATE:  07/31/2016  REFERRING MD:  Eldridge Dace  CHIEF COMPLAINT:  Cardiac Arrest  HISTORY OF PRESENT ILLNESS:  Pt is encephelopathic; therefore, this HPI is obtained from chart review. Jay Livingston is a 71 y.o. male with PMH as outlined below. On morning of 07/07/2016, he was at work sitting at his desk and was found unresponsive with agonal respirations. He was seen roughly 5 minutes prior to this and was normal.  He had 10 minutes of downtime and when EMS arrived, he was found to be in VT without a pulse.  ACLS was performed for 22 minutes before ROSC.  [facility] was [facility] ED where he was intubated and taken to the cath lab emergently - found to have 3VD without interventions, IMpella placed  PCCM was asked to assist with vent management.   SUBJECTIVE:   Off hypothermia protocol. Awake, follows commands. Off norepi  VITAL SIGNS: BP 99/77 (BP Location: Left Arm)   Pulse 77   Temp 97.5 F (36.4 C) (Core (Comment))   Resp 18   Ht 5\' 10"  (1.778 m)   Wt 216 lb 0.8 oz (98 kg)   SpO2 93%   BMI 31.00 kg/m   HEMODYNAMICS: CVP:  [8 mmHg-17 mmHg] 12 mmHg  VENTILATOR SETTINGS: Vent Mode: PRVC FiO2 (%):  [40 %-60 %] 40 % Set Rate:  [18 bmp] 18 bmp Vt Set:  [550 mL] 550 mL PEEP:  [5 cmH20] 5 cmH20 Plateau Pressure:  [16 cmH20-20 cmH20] 19 cmH20  INTAKE / OUTPUT: I/O last 3 completed shifts: In: 8381.1 [I.V.:7010.8; Other:393.7; IV Piggyback:976.7] Out: 1390 [Urine:990; Emesis/NG output:400]   PHYSICAL EXAMINATION: Gen:      No acute distress HEENT:  EOMI, sclera anicteric, ETT in place Neck:     No masses; no thyromegaly Lungs:    Clear to auscultation bilaterally; normal respiratory effort CV:         Regular rate and rhythm; no murmurs Abd:      + bowel sounds; soft, non-tender; no palpable masses, no distension Ext:    No edema; adequate  peripheral perfusion Skin:      Warm and dry; no rash Neuro: Awake, follows commands  LABS:  BMET  Recent Labs Lab 07/30/16 0309 07/30/16 0501  07/30/16 1953 07/31/16 0023 07/31/16 0403  NA 137 138  < > 139 134* 130*  K 3.2* 3.7  < > 3.9 4.9 5.8*  CL 111 111  --   --   --  105  CO2 19* 21*  --   --   --  18*  BUN 13 13  --   --   --  13  CREATININE 0.83 0.79  --   --   --  1.24  GLUCOSE 170* 150*  < > 149* 139* 156*  < > = values in this interval not displayed.  Electrolytes  Recent Labs Lab 07/09/2016 1839  07/30/16 0309 07/30/16 0501 07/31/16 0403  CALCIUM 7.9*  < > 7.3* 7.4* 6.5*  MG 1.8  --   --  2.4 1.7  PHOS  --   --   --  1.4* 4.8*  < > = values in this interval not displayed.  CBC  Recent Labs Lab 07/15/2016 1105  07/30/16 0501  07/30/16 1953 07/31/16 0023 07/31/16 0403  WBC 12.2*  --  12.2*  --   --   --  21.6*  HGB 13.9  < > 13.0  < > 13.6 13.6 13.4  HCT 42.6  < > 38.5*  < > 40.0 40.0 39.9  PLT 166  --  173  --   --   --  166  < > = values in this interval not displayed.  Coag's  Recent Labs Lab 08-20-16 1105 08-20-16 2128  APTT 31 63*  INR 1.09 1.18    Sepsis Markers No results for input(s): LATICACIDVEN, PROCALCITON, O2SATVEN in the last 168 hours.  ABG  Recent Labs Lab 08-20-16 1117 08-20-16 1822  PHART 7.323* 7.467*  PCO2ART 40.1 32.1  PO2ART 110.0* 265.0*    Liver Enzymes  Recent Labs Lab 08-20-16 1105  AST 127*  ALT 82*  ALKPHOS 55  BILITOT 1.0  ALBUMIN 3.1*    Cardiac Enzymes  Recent Labs Lab 08-20-16 1839 07/30/16 0309 07/30/16 0501  TROPONINI 2.64* 1.98* 1.69*    Glucose  Recent Labs Lab 07/31/16 0214 07/31/16 0319 07/31/16 0415 07/31/16 0502 07/31/16 0608 07/31/16 0701  GLUCAP 151* 139* 156* 151* 148* 141*    Imaging Dg Chest Port 1 View  Result Date: 07/31/2016 CLINICAL DATA:  Respiratory failure EXAM: PORTABLE CHEST 1 VIEW COMPARISON:  07/30/2016 FINDINGS: Cardiac shadow is enlarged. An  Impella catheter is noted in satisfactory position. Left jugular central line is again noted and stable. Endotracheal tube and nasogastric catheter are again seen and stable. Bibasilar atelectatic changes are noted left greater than right. Some slight increased density is now seen in the right hemithorax which could be related to a posterior effusion. No other focal abnormality is seen. IMPRESSION: Tubes and lines as described and stable. Bibasilar atelectatic changes. Suspicion for small right pleural effusion. Electronically Signed   By: Alcide CleverMark  Lukens M.D.   On: 07/31/2016 07:28     STUDIES:  CXR 6/27 >  Cath 6/27 >  Echo 6/27 > ef 10% EEG 6/27 >   CULTURES: None.  ANTIBIOTICS: None.  SIGNIFICANT EVENTS: 6/27 > admit, Hypothermia protocol 6/27 recurrent VT  LINES/TUBES: ETT 6/27 >  6/27 lij cvl >> Rt femoral impella>>  DISCUSSION: 71 y.o. male admitted 6/27 after VT cardiac arrest. Had 10 minutes downtime before ACLS started then 22 minutes prior to ROSC.  Intubated and taken to cath lab emergently. VT again post cath 6/27 and Amio drip  ASSESSMENT / PLAN:  PULMONARY A: Respiratory insufficiency - following VT cardiac arrest. P:   Start SBTs today. Follow CXR  CARDIOVASCULAR A:  VT arrest - s/p emergent cath 6/27 with 3VD s/p impella placement. VT again 6/27 amio drip started Impella per cards P:  Post procedural management per cardiology. On Impella Off levaphed Lasix for diuresis  RENAL A:   AKI P:   Replete lytes. Follow urine output and Cr  GASTROINTESTINAL A:   GI prophylaxis. Nutrition. P:   SUP: Pantoprazole. Start tube feeds  HEMATOLOGIC A:   VTE Prophylaxis. At risk for hypothermia induced coagulopathy. P:  SCD's. On hep drip  CBC in AM.  INFECTIOUS A:   No indication of infection. P:   Observe off antibiotics  ENDOCRINE  A:   Hyperglycemia P:   ICU hyperglycemia protocol. Transition to phase 3  NEUROLOGIC A:   Acute  encephalopathy - concern for anoxic brain injury. Reported to be posturing prior to being cooled P:   Finished hypothermia protocol with encouraging mental status on awakening Wean off sedation  Family updated: No family at bedside 6/29 Interdisciplinary Family Meeting v Palliative  Care Meeting:  Due by: 7ds  The patient is critically ill with multiple organ system failure and requires high complexity decision making for assessment and support, frequent evaluation and titration of therapies, advanced monitoring, review of radiographic studies and interpretation of complex data.   Critical Care Time devoted to patient care services, exclusive of separately billable procedures, described in this note is 35 minutes.   Chilton Greathouse MD [facility] Pager [phone] If no answer or after 3pm call: [phone] 07/31/2016, 9:07 AM

## 2016-07-31 NOTE — Progress Notes (Signed)
Femostop removed @ 2300. Femoral site level 1, ecchymosis noted. Pressure dressing placed. Bedrest over @ 0220. Will continue to closely monitor.  Herma ArdMOSELEY, Axle Parfait F, RN

## 2016-07-31 NOTE — Progress Notes (Signed)
  Advanced Heart Failure Rounding Note  PCP:  Primary Cardiologist:   Subjective:    Rewarming.  Remains intubated. Impella P6. Off norepi. Continues on amio drip at 60 mg hour. CVP 13-14. Follows commands.   Todays CO-OX 76%.     Objective:   Weight Range: 216 lb 0.8 oz (98 kg) Body mass index is 31 kg/m.   Vital Signs:   Temp:  [90.1 F (32.3 C)-99.7 F (37.6 C)] 97.7 F (36.5 C) (06/29 0700) Pulse Rate:  [35-89] 80 (06/29 0732) Resp:  [0-26] 26 (06/29 0732) BP: (84-151)/(66-98) 95/67 (06/29 0732) SpO2:  [89 %-99 %] 96 % (06/29 0732) FiO2 (%):  [40 %-60 %] 40 % (06/29 0732) Weight:  [216 lb 0.8 oz (98 kg)] 216 lb 0.8 oz (98 kg) (06/29 0500) Last BM Date:  (PTA)  Weight change: Filed Weights   07/19/2016 1241 07/30/16 0500 07/31/16 0500  Weight: 197 lb 8.5 oz (89.6 kg) 201 lb 8 oz (91.4 kg) 216 lb 0.8 oz (98 kg)    Intake/Output:   Intake/Output Summary (Last 24 hours) at 07/31/16 0742 Last data filed at 07/31/16 0700  Gross per 24 hour  Intake          4654.43 ml  Output              700 ml  Net          3954.43 ml      Physical Exam   CVP 13-14 General: Intubated following commands.  HEENT: Normal except sclerae yellow. ETT Neck: Supple. JVP to jaw . Carotids 2+ bilat; no bruits. No lymphadenopathy or thyromegaly appreciated. Cor: PMI nondisplaced. Regular rate & rhythm. No rubs, gallops or murmurs. Lungs:  Abdomen: Soft, nontender, nondistended. No hepatosplenomegaly. No bruits or masses. Good bowel sounds. Extremities: No cyanosis, clubbing, rash, edema R groin impella ecchymotic at insertion site. R radial A line.  Neuro: intubated. MAE  Follows commands.  Foley: yellow urine.   Telemetry   NSR with NSVT/PVCs   EKG    N/A   Labs    CBC  Recent Labs  07/30/16 0501  07/31/16 0023 07/31/16 0403  WBC 12.2*  --   --  21.6*  HGB 13.0  < > 13.6 13.4  HCT 38.5*  < > 40.0 39.9  MCV 90.0  --   --  91.5  PLT 173  --   --  166  < > =  values in this interval not displayed. Basic Metabolic Panel  Recent Labs  07/30/16 0501  07/31/16 0023 07/31/16 0403  NA 138  < > 134* 130*  K 3.7  < > 4.9 5.8*  CL 111  --   --  105  CO2 21*  --   --  18*  GLUCOSE 150*  < > 139* 156*  BUN 13  --   --  13  CREATININE 0.79  --   --  1.24  CALCIUM 7.4*  --   --  6.5*  MG 2.4  --   --  1.7  PHOS 1.4*  --   --  4.8*  < > = values in this interval not displayed. Liver Function Tests  Recent Labs  07/25/2016 1105  AST 127*  ALT 82*  ALKPHOS 55  BILITOT 1.0  PROT 6.0*  ALBUMIN 3.1*   No results for input(s): LIPASE, AMYLASE in the last 72 hours. Cardiac Enzymes  Recent Labs  07/19/2016 1839 07/30/16 0309 07/30/16 0501  TROPONINI 2.64*   1.98* 1.69*    BNP: BNP (last 3 results) No results for input(s): BNP in the last 8760 hours.  ProBNP (last 3 results) No results for input(s): PROBNP in the last 8760 hours.   D-Dimer No results for input(s): DDIMER in the last 72 hours. Hemoglobin A1C  Recent Labs  07/19/2016 1111  HGBA1C 7.6*   Fasting Lipid Panel  Recent Labs  07/15/2016 1111  CHOL 189  HDL 41  LDLCALC 131*  TRIG 83  CHOLHDL 4.6   Thyroid Function Tests No results for input(s): TSH, T4TOTAL, T3FREE, THYROIDAB in the last 72 hours.  Invalid input(s): FREET3  Other results:   Imaging    Dg Chest Port 1 View  Result Date: 07/31/2016 CLINICAL DATA:  Respiratory failure EXAM: PORTABLE CHEST 1 VIEW COMPARISON:  07/30/2016 FINDINGS: Cardiac shadow is enlarged. An Impella catheter is noted in satisfactory position. Left jugular central line is again noted and stable. Endotracheal tube and nasogastric catheter are again seen and stable. Bibasilar atelectatic changes are noted left greater than right. Some slight increased density is now seen in the right hemithorax which could be related to a posterior effusion. No other focal abnormality is seen. IMPRESSION: Tubes and lines as described and stable.  Bibasilar atelectatic changes. Suspicion for small right pleural effusion. Electronically Signed   By: Inez Catalina M.D.   On: 07/31/2016 07:28      Medications:     Scheduled Medications: . artificial tears  1 application Both Eyes N4B  . aspirin  81 mg Oral Daily  . chlorhexidine gluconate (MEDLINE KIT)  15 mL Mouth Rinse BID  . Chlorhexidine Gluconate Cloth  6 each Topical Daily  . fentaNYL (SUBLIMAZE) injection  50 mcg Intravenous Once  . mouth rinse  15 mL Mouth Rinse 10 times per day  . midazolam  1 mg Intravenous Once  . mupirocin ointment  1 application Nasal BID  . pantoprazole (PROTONIX) IV  40 mg Intravenous QHS  . sodium chloride flush  10-40 mL Intracatheter Q12H     Infusions: . sodium chloride 50 mL/hr at 07/30/16 1900  . amiodarone 60 mg/hr (07/31/16 0221)  . cisatracurium (NIMBEX) infusion Stopped (07/30/16 2330)  . fentaNYL infusion INTRAVENOUS Stopped (07/31/16 0130)  . impella catheter heparin 50 unit/mL in dextrose 5%    . heparin 600 Units/hr (07/31/16 0000)  . insulin (NOVOLIN-R) infusion 1.8 Units/hr (07/31/16 0500)  . midazolam (VERSED) infusion Stopped (07/31/16 0130)  . norepinephrine (LEVOPHED) Adult infusion Stopped (07/31/16 0600)     PRN Medications:  acetaminophen, cisatracurium, fentaNYL, midazolam, ondansetron (ZOFRAN) IV, pneumococcal 23 valent vaccine    Patient Profile  [name] is a 41 year with no past medical history admitted with cardiac arrest and STEMI.   Assessment/Plan  1. Cardiac Arrest/STEMI- LHC with 3 vessel disease. On aspirin Hypothermia Protocol. Rewarming. Following all commands.  2. Cardiogenic Shock  -EF 20% by echo 6/27 Impella 2.5 in place. P6. CVP 10-11. Off norepi CO-OX 76%.  3. Acute Respiratory Failure- Intubated . CCM managing.  4.  Acute Systolic Heart Failure- ECHO with severely reduced EF. Impella P6. Volume status trending up. Give 40 mg IV lasix x1. Check BMET at 1300 No bb with shock. 5. VT-  6/28 with shock x2. Cut back amio to 30 mg per hour.  Mag low supplement. K high. See below.  6. CAD- 3 vessel disease. If recovers will need cardiac surgery to consult.   7. Hyperkalemia- K 5.8 give kayexylate 30 grams .  8. Hyponatremia- Sodium 130 9. Elevated WBC- 21.6 . Check blood, urine, respiratory cultures. Tmax 99.7  10. Hypomagnesium- Mag 1.7. Give 2 grams mag. Repeat Mag in am.  11. Shock Liver- AST/ALT elevated on admit. Trending down. Not on statin.    Length of Stay: 2   Amy Clegg, NP  07/31/2016, 7:42 AM  Advanced Heart Failure Team Pager [phone] (M-F; 7a - 4p)  Please contact [facility] for night-coverage after hours (4p -7a ) and weekends on [email or web address]  Agree with above.   Awake on vent. Following commands but not moving right leg.  Remains with Impella at P-6. (Position stable by echo done at bedside.) Parameters and waveforms lood good.   BP stable. Now off norepi. Rhythm stable. CVP still elevated. Co-ox 83%. Renal function and LFTs improved. WBC up.   On exam Awake on vent. Follows commands. Cor RRR Lungs mild rhonchi Ab obese NT Ext trace edema. Warm. R femoral impella with small ecchymosis. Unable to Doppler pulses on right. (faint on left). Does not mov R leg  Remains critically ill. Hemodynamically stable with Impella support. However unable to move right leg. Leg does not appear ischemic but unable to Doppler pulses. (? Psoas bleed vs ischemia vs CVA) .  Have started milrinone. Turned Impella to P-2 and co-ox 67%. Will d/c impella tonight. Support with inotropes as needed.   CRITICAL CARE Performed by: ,   Total critical care time: 45 minutes  Critical care time was exclusive of separately billable procedures and treating other patients.  Critical care was necessary to treat or prevent imminent or life-threatening deterioration.  Critical care was time spent personally by me (independent of midlevel providers or residents) on  the following activities: development of treatment plan with patient and/or surrogate as well as nursing, discussions with consultants, evaluation of patient's response to treatment, examination of patient, obtaining history from patient or surrogate, ordering and performing treatments and interventions, ordering and review of laboratory studies, ordering and review of radiographic studies, pulse oximetry and re-evaluation of patient's condition.  , , MD  6:12 PM     

## 2016-07-31 NOTE — Progress Notes (Signed)
  Impella device turned down to P-2 and patient stable for 30 mins. Co-ox 67%.  ACT 145.   Impella turned to P-1 and pulled back across the valve. Then device turned off.   Device and sheath pulled at 642pm.  Manual pressure held personally for an hour and 5 mins until adequate hemostasis obtained. Fem stop placed. LE pulses not Dopplerable with Fem Stop at 0 pressure.   Will follow.   Arvilla MeresBensimhon, Daniel, MD  8:06 PM

## 2016-08-01 ENCOUNTER — Encounter (HOSPITAL_COMMUNITY): Admission: EM | Disposition: E | Payer: Self-pay | Source: Home / Self Care | Attending: Interventional Cardiology

## 2016-08-01 ENCOUNTER — Inpatient Hospital Stay (HOSPITAL_COMMUNITY): Payer: Medicare Other

## 2016-08-01 DIAGNOSIS — R570 Cardiogenic shock: Secondary | ICD-10-CM

## 2016-08-01 DIAGNOSIS — L8990 Pressure ulcer of unspecified site, unspecified stage: Secondary | ICD-10-CM | POA: Insufficient documentation

## 2016-08-01 DIAGNOSIS — J9601 Acute respiratory failure with hypoxia: Secondary | ICD-10-CM

## 2016-08-01 HISTORY — PX: RIGHT HEART CATH: CATH118263

## 2016-08-01 LAB — COMPREHENSIVE METABOLIC PANEL
ALK PHOS: 54 U/L (ref 38–126)
ALT: 56 U/L (ref 17–63)
AST: 63 U/L — ABNORMAL HIGH (ref 15–41)
Albumin: 2.4 g/dL — ABNORMAL LOW (ref 3.5–5.0)
Anion gap: 7 (ref 5–15)
BILIRUBIN TOTAL: 1 mg/dL (ref 0.3–1.2)
BUN: 18 mg/dL (ref 6–20)
CALCIUM: 6.6 mg/dL — AB (ref 8.9–10.3)
CO2: 21 mmol/L — ABNORMAL LOW (ref 22–32)
CREATININE: 1.63 mg/dL — AB (ref 0.61–1.24)
Chloride: 102 mmol/L (ref 101–111)
GFR, EST AFRICAN AMERICAN: 48 mL/min — AB (ref >60)
GFR, EST NON AFRICAN AMERICAN: 41 mL/min — AB (ref >60)
Glucose, Bld: 152 mg/dL — ABNORMAL HIGH (ref 65–99)
Potassium: 5.3 mmol/L — ABNORMAL HIGH (ref 3.5–5.1)
Sodium: 130 mmol/L — ABNORMAL LOW (ref 135–145)
TOTAL PROTEIN: 5.1 g/dL — AB (ref 6.5–8.1)

## 2016-08-01 LAB — COOXEMETRY PANEL
CARBOXYHEMOGLOBIN: 0.7 % (ref 0.5–1.5)
Carboxyhemoglobin: 0.8 % (ref 0.5–1.5)
Carboxyhemoglobin: 0.8 % (ref 0.5–1.5)
METHEMOGLOBIN: 1.3 % (ref 0.0–1.5)
METHEMOGLOBIN: 1.2 % (ref 0.0–1.5)
Methemoglobin: 1.3 % (ref 0.0–1.5)
O2 Saturation: 70.1 %
O2 Saturation: 53.3 %
O2 Saturation: 55.7 %
TOTAL HEMOGLOBIN: 10.9 g/dL — AB (ref 12.0–16.0)
TOTAL HEMOGLOBIN: 11.4 g/dL — AB (ref 12.0–16.0)
Total hemoglobin: 10.1 g/dL — ABNORMAL LOW (ref 12.0–16.0)

## 2016-08-01 LAB — POCT I-STAT 3, ART BLOOD GAS (G3+)
ACID-BASE DEFICIT: 9 mmol/L — AB (ref 0.0–2.0)
Bicarbonate: 18.9 mmol/L — ABNORMAL LOW (ref 20.0–28.0)
O2 Saturation: 79 %
PO2 ART: 53 mmHg — AB (ref 83.0–108.0)
TCO2: 20 mmol/L (ref 0–100)
pCO2 arterial: 49 mmHg — ABNORMAL HIGH (ref 32.0–48.0)
pH, Arterial: 7.194 — CL (ref 7.350–7.450)

## 2016-08-01 LAB — GLUCOSE, CAPILLARY
GLUCOSE-CAPILLARY: 266 mg/dL — AB (ref 65–99)
Glucose-Capillary: 135 mg/dL — ABNORMAL HIGH (ref 65–99)
Glucose-Capillary: 155 mg/dL — ABNORMAL HIGH (ref 65–99)
Glucose-Capillary: 172 mg/dL — ABNORMAL HIGH (ref 65–99)

## 2016-08-01 LAB — BASIC METABOLIC PANEL
ANION GAP: 7 (ref 5–15)
BUN: 29 mg/dL — ABNORMAL HIGH (ref 6–20)
CALCIUM: 6.4 mg/dL — AB (ref 8.9–10.3)
CO2: 21 mmol/L — AB (ref 22–32)
CREATININE: 1.78 mg/dL — AB (ref 0.61–1.24)
Chloride: 97 mmol/L — ABNORMAL LOW (ref 101–111)
GFR, EST AFRICAN AMERICAN: 43 mL/min — AB (ref >60)
GFR, EST NON AFRICAN AMERICAN: 37 mL/min — AB (ref >60)
Glucose, Bld: 350 mg/dL — ABNORMAL HIGH (ref 65–99)
Potassium: 5.7 mmol/L — ABNORMAL HIGH (ref 3.5–5.1)
SODIUM: 125 mmol/L — AB (ref 135–145)

## 2016-08-01 LAB — CBC
HCT: 33.5 % — ABNORMAL LOW (ref 39.0–52.0)
Hemoglobin: 11.1 g/dL — ABNORMAL LOW (ref 13.0–17.0)
MCH: 30.9 pg (ref 26.0–34.0)
MCHC: 33.1 g/dL (ref 30.0–36.0)
MCV: 93.3 fL (ref 78.0–100.0)
PLATELETS: 147 10*3/uL — AB (ref 150–400)
RBC: 3.59 MIL/uL — AB (ref 4.22–5.81)
RDW: 14.2 % (ref 11.5–15.5)
WBC: 20 10*3/uL — AB (ref 4.0–10.5)

## 2016-08-01 LAB — LACTIC ACID, PLASMA
LACTIC ACID, VENOUS: 2.1 mmol/L — AB (ref 0.5–1.9)
Lactic Acid, Venous: 3.2 mmol/L (ref 0.5–1.9)

## 2016-08-01 LAB — PHOSPHORUS: PHOSPHORUS: 5.4 mg/dL — AB (ref 2.5–4.6)

## 2016-08-01 LAB — ABO/RH: ABO/RH(D): O POS

## 2016-08-01 LAB — MAGNESIUM: MAGNESIUM: 1.8 mg/dL (ref 1.7–2.4)

## 2016-08-01 SURGERY — INTRA-AORTIC BALLOON PUMP INSERTION

## 2016-08-01 MED ORDER — MIDAZOLAM HCL 2 MG/2ML IJ SOLN
INTRAMUSCULAR | Status: AC
  Filled 2016-08-01: qty 2

## 2016-08-01 MED ORDER — SODIUM BICARBONATE 8.4 % IV SOLN
INTRAVENOUS | Status: DC | PRN
  Administered 2016-08-01: 100 meq via INTRAVENOUS

## 2016-08-01 MED ORDER — STERILE WATER FOR INJECTION IV SOLN
INTRAVENOUS | Status: DC
  Administered 2016-08-01: 22:00:00 via INTRAVENOUS
  Filled 2016-08-01 (×3): qty 850

## 2016-08-01 MED ORDER — ENOXAPARIN SODIUM 40 MG/0.4ML ~~LOC~~ SOLN
40.0000 mg | SUBCUTANEOUS | Status: DC
  Administered 2016-08-01: 40 mg via SUBCUTANEOUS
  Filled 2016-08-01: qty 0.4

## 2016-08-01 MED ORDER — PANTOPRAZOLE SODIUM 40 MG PO PACK
40.0000 mg | PACK | Freq: Every day | ORAL | Status: DC
  Administered 2016-08-01 – 2016-08-05 (×5): 40 mg
  Filled 2016-08-01 (×5): qty 20

## 2016-08-01 MED ORDER — SODIUM POLYSTYRENE SULFONATE 15 GM/60ML PO SUSP
30.0000 g | Freq: Once | ORAL | Status: AC
  Administered 2016-08-01: 30 g
  Filled 2016-08-01: qty 120

## 2016-08-01 MED ORDER — SODIUM BICARBONATE 8.4 % IV SOLN
INTRAVENOUS | Status: AC
  Administered 2016-08-01: 50 meq
  Filled 2016-08-01: qty 50

## 2016-08-01 MED ORDER — IOPAMIDOL (ISOVUE-370) INJECTION 76%
INTRAVENOUS | Status: DC | PRN
  Administered 2016-08-01: 15 mL via INTRA_ARTERIAL

## 2016-08-01 MED ORDER — PIPERACILLIN-TAZOBACTAM 3.375 G IVPB
3.3750 g | Freq: Three times a day (TID) | INTRAVENOUS | Status: DC
  Administered 2016-08-01 – 2016-08-05 (×12): 3.375 g via INTRAVENOUS
  Filled 2016-08-01 (×15): qty 50

## 2016-08-01 MED ORDER — FUROSEMIDE 10 MG/ML IJ SOLN
INTRAMUSCULAR | Status: DC | PRN
  Administered 2016-08-01 (×2): 80 mg via INTRAVENOUS

## 2016-08-01 MED ORDER — NOREPINEPHRINE BITARTRATE 1 MG/ML IV SOLN
0.0000 ug/min | INTRAVENOUS | Status: DC
  Administered 2016-08-01: 45 ug/min via INTRAVENOUS
  Filled 2016-08-01: qty 16

## 2016-08-01 MED ORDER — INSULIN ASPART 100 UNIT/ML ~~LOC~~ SOLN
0.0000 [IU] | SUBCUTANEOUS | Status: DC
  Administered 2016-08-01: 20 [IU] via SUBCUTANEOUS
  Administered 2016-08-02 (×3): 4 [IU] via SUBCUTANEOUS
  Administered 2016-08-03: 3 [IU] via SUBCUTANEOUS
  Administered 2016-08-03: 4 [IU] via SUBCUTANEOUS
  Administered 2016-08-03 – 2016-08-04 (×2): 3 [IU] via SUBCUTANEOUS
  Administered 2016-08-04 (×2): 4 [IU] via SUBCUTANEOUS
  Administered 2016-08-05 (×2): 3 [IU] via SUBCUTANEOUS

## 2016-08-01 MED ORDER — MAGNESIUM SULFATE 2 GM/50ML IV SOLN
2.0000 g | Freq: Once | INTRAVENOUS | Status: AC
  Administered 2016-08-01: 2 g via INTRAVENOUS

## 2016-08-01 MED ORDER — DEXTROSE 5 % IV SOLN
120.0000 mg | Freq: Three times a day (TID) | INTRAVENOUS | Status: DC
  Filled 2016-08-01 (×2): qty 12

## 2016-08-01 MED ORDER — HEPARIN (PORCINE) IN NACL 100-0.45 UNIT/ML-% IJ SOLN
INTRAMUSCULAR | Status: AC | PRN
  Administered 2016-08-01: 600 [IU]/h via INTRAVENOUS

## 2016-08-01 MED ORDER — LIDOCAINE HCL (PF) 1 % IJ SOLN
INTRAMUSCULAR | Status: DC | PRN
  Administered 2016-08-01: 15 mL

## 2016-08-01 MED ORDER — SODIUM CHLORIDE 0.9 % IV SOLN
2000.0000 mg | Freq: Once | INTRAVENOUS | Status: AC
  Administered 2016-08-01: 2000 mg via INTRAVENOUS
  Filled 2016-08-01: qty 2000

## 2016-08-01 MED ORDER — HEPARIN (PORCINE) IN NACL 100-0.45 UNIT/ML-% IJ SOLN
600.0000 [IU]/h | INTRAMUSCULAR | Status: DC
  Administered 2016-08-02: 600 [IU]/h via INTRAVENOUS
  Filled 2016-08-01 (×2): qty 250

## 2016-08-01 MED ORDER — IOPAMIDOL (ISOVUE-370) INJECTION 76%
INTRAVENOUS | Status: AC
  Filled 2016-08-01: qty 125

## 2016-08-01 MED ORDER — SODIUM CHLORIDE 0.9 % IV SOLN
0.0000 mg/h | INTRAVENOUS | Status: DC
  Administered 2016-08-02 (×2): 2 mg/h via INTRAVENOUS
  Administered 2016-08-02: 3 mg/h via INTRAVENOUS
  Administered 2016-08-03: 5 mg/h via INTRAVENOUS
  Administered 2016-08-05: 7 mg/h via INTRAVENOUS
  Filled 2016-08-01 (×8): qty 10

## 2016-08-01 MED ORDER — HEPARIN (PORCINE) IN NACL 2-0.9 UNIT/ML-% IJ SOLN
INTRAMUSCULAR | Status: AC
  Filled 2016-08-01: qty 500

## 2016-08-01 MED ORDER — FUROSEMIDE 10 MG/ML IJ SOLN
INTRAMUSCULAR | Status: AC
  Filled 2016-08-01: qty 8

## 2016-08-01 MED ORDER — SODIUM BICARBONATE 8.4 % IV SOLN
INTRAVENOUS | Status: AC
  Filled 2016-08-01: qty 100

## 2016-08-01 MED ORDER — FUROSEMIDE 10 MG/ML IJ SOLN
80.0000 mg | Freq: Once | INTRAMUSCULAR | Status: AC
  Administered 2016-08-01: 80 mg via INTRAVENOUS

## 2016-08-01 MED ORDER — MIDAZOLAM HCL 2 MG/2ML IJ SOLN
INTRAMUSCULAR | Status: DC | PRN
  Administered 2016-08-01 (×3): 2 mg via INTRAVENOUS

## 2016-08-01 MED ORDER — HEPARIN (PORCINE) IN NACL 2-0.9 UNIT/ML-% IJ SOLN
INTRAMUSCULAR | Status: AC | PRN
  Administered 2016-08-01 (×2): 1000 mL

## 2016-08-01 MED ORDER — HEPARIN SODIUM (PORCINE) 1000 UNIT/ML IJ SOLN
INTRAMUSCULAR | Status: DC | PRN
  Administered 2016-08-01: 1500 [IU] via INTRAVENOUS

## 2016-08-01 MED ORDER — FUROSEMIDE 10 MG/ML IJ SOLN
40.0000 mg | Freq: Two times a day (BID) | INTRAMUSCULAR | Status: AC
  Administered 2016-08-01 (×2): 40 mg via INTRAVENOUS
  Filled 2016-08-01 (×2): qty 4

## 2016-08-01 MED ORDER — LIDOCAINE HCL (PF) 1 % IJ SOLN
INTRAMUSCULAR | Status: AC
  Filled 2016-08-01: qty 30

## 2016-08-01 MED ORDER — SODIUM CHLORIDE 0.9 % IV SOLN
1250.0000 mg | INTRAVENOUS | Status: DC
  Administered 2016-08-02 – 2016-08-03 (×2): 1250 mg via INTRAVENOUS
  Filled 2016-08-01 (×3): qty 1250

## 2016-08-01 MED ORDER — MAGNESIUM SULFATE 2 GM/50ML IV SOLN
INTRAVENOUS | Status: AC
  Filled 2016-08-01: qty 50

## 2016-08-01 MED ORDER — DEXTROSE 5 % IV SOLN
0.5000 ug/min | INTRAVENOUS | Status: DC
  Administered 2016-08-01: 2 ug/min via INTRAVENOUS
  Administered 2016-08-02: 6 ug/min via INTRAVENOUS
  Administered 2016-08-03 – 2016-08-04 (×3): 3 ug/min via INTRAVENOUS
  Filled 2016-08-01 (×5): qty 4

## 2016-08-01 MED ORDER — MAGNESIUM SULFATE 2 GM/50ML IV SOLN
INTRAVENOUS | Status: AC
  Administered 2016-08-01: 2 g via INTRAVENOUS
  Filled 2016-08-01: qty 50

## 2016-08-01 SURGICAL SUPPLY — 19 items
BALLN LINEAR 7.5FR IABP 40CC (BALLOONS) ×4
BALLOON LINEAR 7.5FR IABP 40CC (BALLOONS) ×2 IMPLANT
CATH SWAN GANZ VIP 7.5F (CATHETERS) ×4 IMPLANT
DEVICE SECURE STATLOCK IABP (MISCELLANEOUS) ×8 IMPLANT
GLIDESHEATH SLEND SS 6F .021 (SHEATH) IMPLANT
GUIDEWIRE INQWIRE 1.5J.035X260 (WIRE) ×2 IMPLANT
HOVERMATT SINGLE USE (MISCELLANEOUS) ×4 IMPLANT
INQWIRE 1.5J .035X260CM (WIRE) ×4
KIT ENCORE 26 ADVANTAGE (KITS) IMPLANT
KIT HEART LEFT (KITS) ×4 IMPLANT
PACK CARDIAC CATHETERIZATION (CUSTOM PROCEDURE TRAY) ×4 IMPLANT
SHEATH PINNACLE 6F 10CM (SHEATH) ×4 IMPLANT
SHEATH PINNACLE 8F 10CM (SHEATH) ×4 IMPLANT
SLEEVE REPOSITIONING LENGTH 30 (MISCELLANEOUS) ×4 IMPLANT
TRANSDUCER W/STOPCOCK (MISCELLANEOUS) ×4 IMPLANT
TUBING CIL FLEX 10 FLL-RA (TUBING) ×4 IMPLANT
WIRE EMERALD 3MM-J .025X260CM (WIRE) ×4 IMPLANT
WIRE EMERALD 3MM-J .035X150CM (WIRE) ×4 IMPLANT
WIRE HI TORQ VERSACORE-J 145CM (WIRE) ×4 IMPLANT

## 2016-08-01 NOTE — Interval H&P Note (Signed)
History and Physical Interval Note:  07/12/2016 10:53 PM  Jay Livingston  has presented today for surgery, with the diagnosis of cardiogenic shock The various methods of treatment have been discussed with the patient and family. After consideration of risks, benefits and other options for treatment, the patient has consented to  Procedure(s): Intra-Aortic Balloon Pump Insertion Right Heart Cath (N/A) as a surgical intervention .  The patient's history has been reviewed, patient examined, no change in status, stable for surgery.  I have reviewed the patient's chart and labs.  Questions were answered to the patient's satisfaction.     Bensimhon, Reuel Boomaniel

## 2016-08-01 NOTE — Progress Notes (Addendum)
PULMONARY / CRITICAL CARE MEDICINE   Name: Jay Livingston MRN: [id number] DOB: 01/28/1946    ADMISSION DATE:  07/18/2016 CONSULTATION DATE:  07/07/2016  REFERRING MD:  Eldridge Dace  CHIEF COMPLAINT:  Cardiac Arrest  HISTORY OF PRESENT ILLNESS:  Pt is encephelopathic; therefore, this HPI is obtained from chart review. Jay Livingston is a 71 y.o. male with PMH as outlined below. On morning of 07/10/2016, he was at work sitting at his desk and was found unresponsive with agonal respirations. He was seen roughly 5 minutes prior to this and was normal.  He had 10 minutes of downtime and when EMS arrived, he was found to be in VT without a pulse.  ACLS was performed for 22 minutes before ROSC.  He was brought to [facility] ED where he was intubated and taken to the cath lab emergently - found to have 3VD without interventions, IMpella placed  PCCM was asked to assist with vent management.  SUBJECTIVE:   Impella removed yesterday Still on levo, milrinone, amio Did not do well on weaning trial today AM  VITAL SIGNS: BP (!) 112/57   Pulse 90   Temp 98.1 F (36.7 C) (Core (Comment))   Resp 18   Ht 5\' 10"  (1.778 m)   Wt 228 lb 2.8 oz (103.5 kg)   SpO2 94%   BMI 32.74 kg/m   HEMODYNAMICS: CVP:  [6 mmHg-14 mmHg] 10 mmHg  VENTILATOR SETTINGS: Vent Mode: PRVC FiO2 (%):  [40 %-60 %] 60 % Set Rate:  [18 bmp] 18 bmp Vt Set:  [550 mL] 550 mL PEEP:  [5 cmH20] 5 cmH20 Plateau Pressure:  [19 cmH20-20 cmH20] 19 cmH20  INTAKE / OUTPUT: I/O last 3 completed shifts: In: 6046.6 [I.V.:4886.7; Other:255.9; NG/GT:854; IV Piggyback:50] Out: 2710 [Urine:2510; Emesis/NG output:200]   PHYSICAL EXAMINATION: Gen:      No acute distress HEENT:  EOMI, sclera anicteric Neck:     No masses; no thyromegaly Lungs:    B/L rhonchi; normal respiratory effort CV:         Regular rate and rhythm; no murmurs Abd:      + bowel sounds; soft, non-tender; no palpable masses, no distension Skin:      Warm and dry; no  rash Neuro: Sedated, opens eyes to commands  LABS:  BMET  Recent Labs Lab 07/31/16 1313 07/31/16 2200 08/19/2016 0329  NA 130* 128* 130*  K 5.3* 5.2* 5.3*  CL 105 101 102  CO2 18* 19* 21*  BUN 13 16 18   CREATININE 1.32* 1.57* 1.63*  GLUCOSE 144* 166* 152*    Electrolytes  Recent Labs Lab 07/14/2016 1839  07/30/16 0501 07/31/16 0403 07/31/16 1313 07/31/16 2200 05-Aug-2016 0329  CALCIUM 7.9*  < > 7.4* 6.5* 6.7* 6.5* 6.6*  MG 1.8  --  2.4 1.7  --   --   --   PHOS  --   --  1.4* 4.8*  --   --   --   < > = values in this interval not displayed.  CBC  Recent Labs Lab 07/31/16 0403 07/31/16 2200 08/08/2016 0329  WBC 21.6* 18.6* 20.0*  HGB 13.4 11.2* 11.1*  HCT 39.9 33.7* 33.5*  PLT 166 144* 147*    Coag's  Recent Labs Lab 07/30/2016 1105 07/09/2016 2128  APTT 31 63*  INR 1.09 1.18    Sepsis Markers No results for input(s): LATICACIDVEN, PROCALCITON, O2SATVEN in the last 168 hours.  ABG  Recent Labs Lab 07/10/2016 1117 07/19/2016 1822  PHART 7.323* 7.467*  PCO2ART 40.1 32.1  PO2ART 110.0* 265.0*    Liver Enzymes  Recent Labs Lab 07/16/2016 1105 07/31/16 0403 07/31/16 1313 07/19/2016 0329  AST 127* 85* 75* 63*  ALT 82* 70* 61 56  ALKPHOS 55  --  46 54  BILITOT 1.0  --  1.2 1.0  ALBUMIN 3.1*  --  2.4* 2.4*    Cardiac Enzymes  Recent Labs Lab 07/28/2016 1839 07/30/16 0309 07/30/16 0501  TROPONINI 2.64* 1.98* 1.69*    Glucose  Recent Labs Lab 07/31/16 1308 07/31/16 1508 07/31/16 1614 07/31/16 2040 07/31/16 2358 07/11/2016 0334  GLUCAP 144* 131* 150* 168* 155* 135*    Imaging Ct Head Wo Contrast  Result Date: 07/19/2016 CLINICAL DATA:  71 y/o M; status post hypothermia protocol. Patient not moving right leg. EXAM: CT HEAD WITHOUT CONTRAST TECHNIQUE: Contiguous axial images were obtained from the base of the skull through the vertex without intravenous contrast. COMPARISON:  None. FINDINGS: Brain: 2 punctate foci of increased density of  present within the pons (series 3, image 10 and 11) that are too small to characterize and may represent mineralization or microhemorrhage. No additional evidence for intracranial hemorrhage identified. No evidence for large territory infarct or focal mass effect. There are a few nonspecific foci of hypoattenuation within subcortical and periventricular white matter compatible with mild chronic microvascular ischemic changes. Mild brain parenchymal volume loss. Vascular: Mild calcific atherosclerosis of the carotid siphons. No hyperdense vessel identified. Skull: Normal. Negative for fracture or focal lesion. Sinuses/Orbits: Moderate diffuse paranasal sinus mucosal thickening and partial bilateral mastoid effusions probably due to intubation. Other: Negative. IMPRESSION: 1. Two punctate focus of increased density within the pons are too small to characterize. These may represent mineralization or microhemorrhage. No additional evidence for intracranial hemorrhage. 2. No evidence for large territory infarct or focal mass effect. 3. Mild for age chronic microvascular ischemic changes and mild parenchymal volume loss of the brain. These results will be called to the ordering clinician or representative by the Radiologist Assistant, and communication documented in the PACS or zVision Dashboard. Electronically Signed   By: Mitzi Hansen M.D.   On: 07/11/2016 05:37   STUDIES:  CXR 6/27 >  Cath 6/27 >  Echo 6/27 > ef 10% EEG 6/27 >   CULTURES: None.  ANTIBIOTICS: None.  SIGNIFICANT EVENTS: 6/27 > admit, Hypothermia protocol 6/27 recurrent VT  LINES/TUBES: ETT 6/27 >  6/27 lij cvl >> Rt femoral impella>> removed 6/29  DISCUSSION: 71 y.o. male admitted 6/27 after VT cardiac arrest. Had 10 minutes downtime before ACLS started then 22 minutes prior to ROSC.  Intubated and taken to cath lab emergently. VT again post cath 6/27 and Amio drip  ASSESSMENT / PLAN:  PULMONARY A: Respiratory  insufficiency - following VT cardiac arrest. P:   Continue vent support Continue SBTs as tolerated Follow CXR  CARDIOVASCULAR A:  VT arrest - s/p emergent cath 6/27 with 3VD s/p impella placement. VT again 6/27 amio drip started Impella per cards P:  Post procedural management per cardiology. Continue levo, milrinone, amio gtt Lasix for diuresis  RENAL A:   AKI P:   Follow urine output and Cr  GASTROINTESTINAL A:   GI prophylaxis. Nutrition. P:   SUP: Pantoprazole. Tube feeds  HEMATOLOGIC A:   VTE Prophylaxis. At risk for hypothermia induced coagulopathy. P:  Follow CBC  INFECTIOUS A:   No indication of infection. P:   Observe off antibiotics  ENDOCRINE  A:   Hyperglycemia P:   On lantus  and SSI  NEUROLOGIC A:   Acute encephalopathy - concern for anoxic brain injury. Reported to be posturing prior to being cooled P:   Finished hypothermia protocol with encouraging mental status on awakening Wean off sedation  Family updated: No family at bedside 6/30 Interdisciplinary Family Meeting v Palliative Care Meeting:  Due by: 7ds  The patient is critically ill with multiple organ system failure and requires high complexity decision making for assessment and support, frequent evaluation and titration of therapies, advanced monitoring, review of radiographic studies and interpretation of complex data.   Critical Care Time devoted to patient care services, exclusive of separately billable procedures, described in this note is 32 minutes.   Chilton GreathousePraveen Conway Fedora MD [facility] Pager [phone] If no answer or after 3pm call: [phone] 07/10/2016, 8:03 AM

## 2016-08-01 NOTE — Progress Notes (Signed)
Pharmacy Antibiotic Note  Jay AkinRichard Livingston is a 71 y.o. male admitted on December 05, 2016 with pneumonia.  Pharmacy has been consulted for vancomycin and zosyn dosing.  Patient s/p vfib arrest, s/p hypothermia protocol and impella pump removal yesterday. Possible aspiration, fevers likely being supressed by artic sun pads, wbc elevated at 20 will start empiric abx. Cultures pending from yesterday.  Plan: Vancomycin 2g IV once then 1250mg  every 24 hours.  Goal trough 15-20 mcg/mL. Zosyn 3.375g IV q8h (4 hour infusion).  Height: 5\' 10"  (177.8 cm) Weight: 228 lb 2.8 oz (103.5 kg) IBW/kg (Calculated) : 73  Temp (24hrs), Avg:98.6 F (37 C), Min:97.5 F (36.4 C), Max:99.5 F (37.5 C)   Recent Labs Lab 18-Jan-2017 1105  07/30/16 0501 07/31/16 0403 07/31/16 1313 07/31/16 2200 07/31/2016 0329  WBC 12.2*  --  12.2* 21.6*  --  18.6* 20.0*  CREATININE 1.14  < > 0.79 1.24 1.32* 1.57* 1.63*  < > = values in this interval not displayed.  Estimated Creatinine Clearance: 50.8 mL/min (A) (by C-G formula based on SCr of 1.63 mg/dL (H)).    No Known Allergies  Thank you for allowing pharmacy to be a part of this patient's care.  Jay CoilFrank Livingston PharmD., BCPS Clinical Pharmacist Pager [phone] 07/19/2016 8:54 AM

## 2016-08-01 NOTE — H&P (View-Only) (Signed)
   ADVANCED HF TEAM   Impella pulled last night due to inability move RLE and possible vascular compromise.   Patient was stable on low-dose pressors throughout the day.  Earlier this evening developed recurrent VT/VF receiving several shocks.   Mag supped.  Developed progressive shock, acidosis and hypoxemia despite increasing pressors.   RLE appearing progressively ischemic with vasoconstriction from pressors.  Decision made to take emergently to cath lab for left femoral IABP and swan as a salvage attempt.   Family aware of probable poor prognosis.   D/w Dr. Excell Seltzerooper by phone.   Total additional CCT 55 minutes  Bensimhon, Reuel Boomaniel, MD  10:51 PM

## 2016-08-01 NOTE — Progress Notes (Addendum)
Advanced Heart Failure Rounding Note  PCP:  Primary Cardiologist:   Subjective:    Impella pulled last night.   Remains intubated. Awake at time. Responds to commands. Failed wean today. Remains on norepi 20. Milrinone 0.125. Co-ox 70%. CVP 10.  Still not moving RLE. Head CT last night (viewed personally).    Objective:   Weight Range: 103.5 kg (228 lb 2.8 oz) Body mass index is 32.74 kg/m.   Vital Signs:   Temp:  [97.5 F (36.4 C)-99.5 F (37.5 C)] 98.1 F (36.7 C) (06/30 0733) Pulse Rate:  [80-97] 90 (06/30 0800) Resp:  [11-29] 18 (06/30 0800) BP: (83-126)/(46-83) 112/57 (06/30 0700) SpO2:  [90 %-98 %] 94 % (06/30 0800) FiO2 (%):  [40 %-60 %] 60 % (06/30 0400) Weight:  [103.5 kg (228 lb 2.8 oz)] 103.5 kg (228 lb 2.8 oz) (06/30 0400) Last BM Date:  (PTA)  Weight change: Filed Weights   07/30/16 0500 07/31/16 0500 07/07/2016 0400  Weight: 91.4 kg (201 lb 8 oz) 98 kg (216 lb 0.8 oz) 103.5 kg (228 lb 2.8 oz)    Intake/Output:   Intake/Output Summary (Last 24 hours) at 07/13/2016 0807 Last data filed at 07/11/2016 0800  Gross per 24 hour  Intake          3933.93 ml  Output             2215 ml  Net          1718.93 ml      Physical Exam   CVP 10 General:  Intubated. Awake at timea HEENT: normal ETT Neck: supple. RIJ TLC  Carotids 2+ bilat; no bruits. No lymphadenopathy or thryomegaly appreciated. Cor: PMI laterally displaced. Regular rate & rhythm. No rubs, gallops or murmurs. Lungs: rhonchorous Abdomen: soft, nontender, nondistended. No hepatosplenomegaly. No bruits or masses. Good bowel sounds. Extremities: R groin small hematoma. +echymosis. Cool. Pulse not dopplerable on R faint on left Neuro: awake on vent won't move RLE  Telemetry   NSR with PVCs Personally reviewed   EKG    N/A   Labs    CBC  Recent Labs  07/31/16 2200 07/03/2016 0329  WBC 18.6* 20.0*  HGB 11.2* 11.1*  HCT 33.7* 33.5*  MCV 91.3 93.3  PLT 144* 629*   Basic Metabolic  Panel  Recent Labs  07/30/16 0501  07/31/16 0403  07/31/16 2200 07/16/2016 0329  NA 138  < > 130*  < > 128* 130*  K 3.7  < > 5.8*  < > 5.2* 5.3*  CL 111  --  105  < > 101 102  CO2 21*  --  18*  < > 19* 21*  GLUCOSE 150*  < > 156*  < > 166* 152*  BUN 13  --  13  < > 16 18  CREATININE 0.79  --  1.24  < > 1.57* 1.63*  CALCIUM 7.4*  --  6.5*  < > 6.5* 6.6*  MG 2.4  --  1.7  --   --   --   PHOS 1.4*  --  4.8*  --   --   --   < > = values in this interval not displayed. Liver Function Tests  Recent Labs  07/31/16 1313 07/07/2016 0329  AST 75* 63*  ALT 61 56  ALKPHOS 46 54  BILITOT 1.2 1.0  PROT 5.3* 5.1*  ALBUMIN 2.4* 2.4*   No results for input(s): LIPASE, AMYLASE in the last 72 hours. Cardiac Enzymes  Recent Labs  07/22/2016 1839 07/30/16 0309 07/30/16 0501  TROPONINI 2.64* 1.98* 1.69*    BNP: BNP (last 3 results) No results for input(s): BNP in the last 8760 hours.  ProBNP (last 3 results) No results for input(s): PROBNP in the last 8760 hours.   D-Dimer No results for input(s): DDIMER in the last 72 hours. Hemoglobin A1C  Recent Labs  07/24/2016 1111  HGBA1C 7.6*   Fasting Lipid Panel  Recent Labs  07/23/2016 1111  CHOL 189  HDL 41  LDLCALC 131*  TRIG 83  CHOLHDL 4.6   Thyroid Function Tests No results for input(s): TSH, T4TOTAL, T3FREE, THYROIDAB in the last 72 hours.  Invalid input(s): FREET3  Other results:   Imaging    Ct Head Wo Contrast  Result Date: 07/13/2016 CLINICAL DATA:  71 y/o M; status post hypothermia protocol. Patient not moving right leg. EXAM: CT HEAD WITHOUT CONTRAST TECHNIQUE: Contiguous axial images were obtained from the base of the skull through the vertex without intravenous contrast. COMPARISON:  None. FINDINGS: Brain: 2 punctate foci of increased density of present within the pons (series 3, image 10 and 11) that are too small to characterize and may represent mineralization or microhemorrhage. No additional evidence  for intracranial hemorrhage identified. No evidence for large territory infarct or focal mass effect. There are a few nonspecific foci of hypoattenuation within subcortical and periventricular white matter compatible with mild chronic microvascular ischemic changes. Mild brain parenchymal volume loss. Vascular: Mild calcific atherosclerosis of the carotid siphons. No hyperdense vessel identified. Skull: Normal. Negative for fracture or focal lesion. Sinuses/Orbits: Moderate diffuse paranasal sinus mucosal thickening and partial bilateral mastoid effusions probably due to intubation. Other: Negative. IMPRESSION: 1. Two punctate focus of increased density within the pons are too small to characterize. These may represent mineralization or microhemorrhage. No additional evidence for intracranial hemorrhage. 2. No evidence for large territory infarct or focal mass effect. 3. Mild for age chronic microvascular ischemic changes and mild parenchymal volume loss of the brain. These results will be called to the ordering clinician or representative by the Radiologist Assistant, and communication documented in the PACS or zVision Dashboard. Electronically Signed   By: Kristine Garbe M.D.   On: 07/05/2016 05:37     Medications:     Scheduled Medications: . aspirin  81 mg Oral Daily  . chlorhexidine gluconate (MEDLINE KIT)  15 mL Mouth Rinse BID  . Chlorhexidine Gluconate Cloth  6 each Topical Daily  . feeding supplement (PRO-STAT SUGAR FREE 64)  30 mL Per Tube 5 X Daily  . feeding supplement (VITAL HIGH PROTEIN)  1,000 mL Per Tube Q24H  . fentaNYL (SUBLIMAZE) injection  50 mcg Intravenous Once  . insulin aspart  1 Units Subcutaneous Q4H  . insulin aspart  2-6 Units Subcutaneous Q4H  . insulin glargine  10 Units Subcutaneous Daily  . mouth rinse  15 mL Mouth Rinse 10 times per day  . mupirocin ointment  1 application Nasal BID  . pantoprazole (PROTONIX) IV  40 mg Intravenous QHS  . sodium chloride  flush  10-40 mL Intracatheter Q12H    Infusions: . sodium chloride 50 mL/hr at 07/23/2016 0800  . amiodarone 30 mg/hr (07/17/2016 0800)  . dextrose    . fentaNYL infusion INTRAVENOUS 125 mcg/hr (07/21/2016 0800)  . impella catheter heparin 50 unit/mL in dextrose 5% Stopped (07/31/16 1842)  . heparin Stopped (07/31/16 1400)  . milrinone 0.125 mcg/kg/min (07/15/2016 0800)  . norepinephrine (LEVOPHED) Adult infusion 20 mcg/min (07/22/2016  0800)    PRN Medications: acetaminophen, dextrose, fentaNYL (SUBLIMAZE) injection, midazolam, ondansetron (ZOFRAN) IV, pneumococcal 23 valent vaccine    Patient Profile   Mr Tracz is a 27 year with no past medical history admitted with cardiac/VF arrest. Cath with severe (chronic 3v CAD). Impella placed. Underwent hypothermia protocol.    Assessment/Plan   1. Cardiac/VF Arrest - LHC with severe chronic 3 vessel disease -s/p hypothermia protocol and Impella placement -Impella removed 6/29 - Awake on vent. Head CT 6/29. No acute process 2. Acute Systolic Heart Failure -> Cardiogenic Shock  -EF 20% by echo 6/27 -Co-ox good on NE and milrinone wean NE as tolerated -Will diurese gently today 3. Acute Respiratory Failure - Intubated. Failed wean today. Will diurese gently. CCM following - Repeat CXR in am 4. VT/VF - 6/28 with shock x2. Continue IV amio. Keep K >4 and Mg > 2 - EP to see once recovers re ICD vs LifeVest 5. CAD - 3 vessel disease. If recovers will need cardiac surgery to consult.   - Continue ASA 6. RLE weakness - Head CT 6/29 negative - ? Nerve injury vs psoas bleed. Continue to follow. May need CT of pelvis. No evidence of threatening limb ischemia  7. Hyperkalemia - Stable. Will follow 8. Hyponatremia - Sodium 130 9. Elevated WBC - Low-grade temp recently Still with cooling pads in place. WBC climbing - BCX and Sputum cx pending. Will start vanc and zosyn 10. Shock Live - AST/ALT elevated on admit. Trending down. Not on statin.    11. Acute blood loss anemia - Stable this am  12. AKI - Creatinine up 1.3 ->1.6. Will follow.   CRITICAL CARE Performed by: Glori Bickers  Total critical care time: 45 minutes  Critical care time was exclusive of separately billable procedures and treating other patients.  Critical care was necessary to treat or prevent imminent or life-threatening deterioration.  Critical care was time spent personally by me (independent of midlevel providers or residents) on the following activities: development of treatment plan with patient and/or surrogate as well as nursing, discussions with consultants, evaluation of patient's response to treatment, examination of patient, obtaining history from patient or surrogate, ordering and performing treatments and interventions, ordering and review of laboratory studies, ordering and review of radiographic studies, pulse oximetry and re-evaluation of patient's condition.    Length of Stay: 3   Glori Bickers, MD  07/09/2016, 8:07 AM  Advanced Heart Failure Team Pager [phone] (M-F; 7a - 4p)  Please contact [facility] for night-coverage after hours (4p -7a ) and weekends on [email or web address]

## 2016-08-01 NOTE — Progress Notes (Signed)
Patient transported form 2H06 to CT and back without any complications.

## 2016-08-01 NOTE — Progress Notes (Signed)
   ADVANCED HF TEAM   Impella pulled last night due to inability move RLE and possible vascular compromise.   Patient was stable on low-dose pressors throughout the day.  Earlier this evening developed recurrent VT/VF receiving several shocks.   Mag supped.  Developed progressive shock, acidosis and hypoxemia despite increasing pressors.   RLE appearing progressively ischemic with vasoconstriction from pressors.  Decision made to take emergently to cath lab for left femoral IABP and swan as a salvage attempt.   Family aware of probable poor prognosis.   D/w Dr. Cooper by phone.   Total additional CCT 55 minutes  Brendalyn Vallely, MD  10:51 PM   

## 2016-08-01 NOTE — Progress Notes (Signed)
Pt's sister called to inform her of pt status and plan. Sister made aware that pt was shocked this evening and that new labs were drawn. All questions answered.

## 2016-08-01 NOTE — Progress Notes (Signed)
eLink made aware of results of head CT. Will continue to monitor.  Herma ArdMOSELEY, Tephanie Escorcia F, RN

## 2016-08-01 NOTE — Progress Notes (Signed)
RT called due to desynchrony with vent and pt desat.  RT changed pt from Peep 5+ to 10+ that has increased sats to 91-94%.  RT also changed settings tom Pressure Control ventilation and pt is much more synchronous with vent.  RT called the black box to verify settings and get order.  Secretary stated the dr was on a nother call and would call back asap.  RT will monitor and wait for dr call.  RT will pull ABG in 1 hour.

## 2016-08-01 NOTE — Progress Notes (Signed)
HF MD notified with pt ABG and fluid overload concerns. New orders obtained. Will continue to monitor.

## 2016-08-01 NOTE — Progress Notes (Signed)
Elink notified about patients abnormal ABG result and elevated CBG. MD unavailable at this time. Will continue to monitor.

## 2016-08-01 NOTE — Progress Notes (Signed)
Pt in VT/ [place] rhythm. Shocked one time with 120 Joules. Pt converted back to sinus rhythm. HF MD notified. New orders obtained.

## 2016-08-02 ENCOUNTER — Inpatient Hospital Stay (HOSPITAL_COMMUNITY): Payer: Medicare Other

## 2016-08-02 ENCOUNTER — Inpatient Hospital Stay (HOSPITAL_COMMUNITY): Payer: Medicare Other | Admitting: Certified Registered"

## 2016-08-02 ENCOUNTER — Encounter (HOSPITAL_COMMUNITY): Admission: EM | Disposition: E | Payer: Self-pay | Source: Home / Self Care | Attending: Interventional Cardiology

## 2016-08-02 DIAGNOSIS — I998 Other disorder of circulatory system: Secondary | ICD-10-CM

## 2016-08-02 DIAGNOSIS — M62261 Nontraumatic ischemic infarction of muscle, right lower leg: Secondary | ICD-10-CM

## 2016-08-02 HISTORY — PX: FEMORAL ARTERY EXPLORATION: SHX5160

## 2016-08-02 HISTORY — PX: ENDARTERECTOMY FEMORAL: SHX5804

## 2016-08-02 HISTORY — PX: FASCIOTOMY: SHX132

## 2016-08-02 LAB — CULTURE, RESPIRATORY: CULTURE: NORMAL

## 2016-08-02 LAB — COMPREHENSIVE METABOLIC PANEL
ALK PHOS: 52 U/L (ref 38–126)
ALT: 51 U/L (ref 17–63)
ANION GAP: 11 (ref 5–15)
AST: 84 U/L — ABNORMAL HIGH (ref 15–41)
Albumin: 1.9 g/dL — ABNORMAL LOW (ref 3.5–5.0)
BILIRUBIN TOTAL: 1.2 mg/dL (ref 0.3–1.2)
BUN: 32 mg/dL — ABNORMAL HIGH (ref 6–20)
CALCIUM: 6.7 mg/dL — AB (ref 8.9–10.3)
CO2: 24 mmol/L (ref 22–32)
Chloride: 95 mmol/L — ABNORMAL LOW (ref 101–111)
Creatinine, Ser: 1.93 mg/dL — ABNORMAL HIGH (ref 0.61–1.24)
GFR, EST AFRICAN AMERICAN: 39 mL/min — AB (ref >60)
GFR, EST NON AFRICAN AMERICAN: 34 mL/min — AB (ref >60)
GLUCOSE: 200 mg/dL — AB (ref 65–99)
POTASSIUM: 4.5 mmol/L (ref 3.5–5.1)
Sodium: 130 mmol/L — ABNORMAL LOW (ref 135–145)
TOTAL PROTEIN: 4.6 g/dL — AB (ref 6.5–8.1)

## 2016-08-02 LAB — POCT I-STAT 3, VENOUS BLOOD GAS (G3P V)
Acid-base deficit: 3 mmol/L — ABNORMAL HIGH (ref 0.0–2.0)
Acid-base deficit: 9 mmol/L — ABNORMAL HIGH (ref 0.0–2.0)
Bicarbonate: 20.4 mmol/L (ref 20.0–28.0)
Bicarbonate: 24.7 mmol/L (ref 20.0–28.0)
O2 Saturation: 25 %
O2 Saturation: 45 %
PCO2 VEN: 53.9 mmHg (ref 44.0–60.0)
PCO2 VEN: 59.4 mmHg (ref 44.0–60.0)
PH VEN: 7.144 — AB (ref 7.250–7.430)
PO2 VEN: 23 mmHg — AB (ref 32.0–45.0)
PO2 VEN: 29 mmHg — AB (ref 32.0–45.0)
TCO2: 22 mmol/L (ref 0–100)
TCO2: 26 mmol/L (ref 0–100)
pH, Ven: 7.269 (ref 7.250–7.430)

## 2016-08-02 LAB — CBC
HCT: 26.1 % — ABNORMAL LOW (ref 39.0–52.0)
HCT: 31.9 % — ABNORMAL LOW (ref 39.0–52.0)
HEMATOCRIT: 24.1 % — AB (ref 39.0–52.0)
HEMOGLOBIN: 8.9 g/dL — AB (ref 13.0–17.0)
HEMOGLOBIN: 8.1 g/dL — AB (ref 13.0–17.0)
HEMOGLOBIN: 10.6 g/dL — AB (ref 13.0–17.0)
MCH: 31.1 pg (ref 26.0–34.0)
MCH: 30.1 pg (ref 26.0–34.0)
MCH: 29 pg (ref 26.0–34.0)
MCHC: 34.1 g/dL (ref 30.0–36.0)
MCHC: 33.6 g/dL (ref 30.0–36.0)
MCHC: 33.2 g/dL (ref 30.0–36.0)
MCV: 91.3 fL (ref 78.0–100.0)
MCV: 89.6 fL (ref 78.0–100.0)
MCV: 87.2 fL (ref 78.0–100.0)
Platelets: 111 10*3/uL — ABNORMAL LOW (ref 150–400)
Platelets: 94 10*3/uL — ABNORMAL LOW (ref 150–400)
Platelets: 86 10*3/uL — ABNORMAL LOW (ref 150–400)
RBC: 2.86 MIL/uL — AB (ref 4.22–5.81)
RBC: 2.69 MIL/uL — ABNORMAL LOW (ref 4.22–5.81)
RBC: 3.66 MIL/uL — AB (ref 4.22–5.81)
RDW: 14.1 % (ref 11.5–15.5)
RDW: 14.1 % (ref 11.5–15.5)
RDW: 15.6 % — ABNORMAL HIGH (ref 11.5–15.5)
WBC: 11.4 10*3/uL — ABNORMAL HIGH (ref 4.0–10.5)
WBC: 9.5 10*3/uL (ref 4.0–10.5)
WBC: 10 10*3/uL (ref 4.0–10.5)

## 2016-08-02 LAB — POCT I-STAT 7, (LYTES, BLD GAS, ICA,H+H)
ACID-BASE DEFICIT: 2 mmol/L (ref 0.0–2.0)
Acid-base deficit: 3 mmol/L — ABNORMAL HIGH (ref 0.0–2.0)
BICARBONATE: 23.6 mmol/L (ref 20.0–28.0)
BICARBONATE: 24.3 mmol/L (ref 20.0–28.0)
CALCIUM ION: 0.89 mmol/L — AB (ref 1.15–1.40)
CALCIUM ION: 0.9 mmol/L — AB (ref 1.15–1.40)
HCT: 23 % — ABNORMAL LOW (ref 39.0–52.0)
HCT: 30 % — ABNORMAL LOW (ref 39.0–52.0)
HEMOGLOBIN: 7.8 g/dL — AB (ref 13.0–17.0)
Hemoglobin: 10.2 g/dL — ABNORMAL LOW (ref 13.0–17.0)
O2 Saturation: 100 %
O2 Saturation: 100 %
PH ART: 7.325 — AB (ref 7.350–7.450)
PO2 ART: 194 mmHg — AB (ref 83.0–108.0)
Potassium: 3.5 mmol/L (ref 3.5–5.1)
Potassium: 4.2 mmol/L (ref 3.5–5.1)
SODIUM: 136 mmol/L (ref 135–145)
SODIUM: 134 mmol/L — AB (ref 135–145)
TCO2: 25 mmol/L (ref 0–100)
TCO2: 26 mmol/L (ref 0–100)
pCO2 arterial: 45.2 mmHg (ref 32.0–48.0)
pCO2 arterial: 50.5 mmHg — ABNORMAL HIGH (ref 32.0–48.0)
pH, Arterial: 7.29 — ABNORMAL LOW (ref 7.350–7.450)
pO2, Arterial: 198 mmHg — ABNORMAL HIGH (ref 83.0–108.0)

## 2016-08-02 LAB — POCT I-STAT 3, ART BLOOD GAS (G3+)
ACID-BASE DEFICIT: 11 mmol/L — AB (ref 0.0–2.0)
ACID-BASE DEFICIT: 10 mmol/L — AB (ref 0.0–2.0)
ACID-BASE DEFICIT: 2 mmol/L (ref 0.0–2.0)
ACID-BASE EXCESS: 2 mmol/L (ref 0.0–2.0)
Acid-Base Excess: 2 mmol/L (ref 0.0–2.0)
Acid-Base Excess: 3 mmol/L — ABNORMAL HIGH (ref 0.0–2.0)
Acid-base deficit: 3 mmol/L — ABNORMAL HIGH (ref 0.0–2.0)
Acid-base deficit: 4 mmol/L — ABNORMAL HIGH (ref 0.0–2.0)
Acid-base deficit: 8 mmol/L — ABNORMAL HIGH (ref 0.0–2.0)
BICARBONATE: 22 mmol/L (ref 20.0–28.0)
BICARBONATE: 26.5 mmol/L (ref 20.0–28.0)
BICARBONATE: 27.5 mmol/L (ref 20.0–28.0)
BICARBONATE: 27.6 mmol/L (ref 20.0–28.0)
Bicarbonate: 16.7 mmol/L — ABNORMAL LOW (ref 20.0–28.0)
Bicarbonate: 18.5 mmol/L — ABNORMAL LOW (ref 20.0–28.0)
Bicarbonate: 19.5 mmol/L — ABNORMAL LOW (ref 20.0–28.0)
Bicarbonate: 22.6 mmol/L (ref 20.0–28.0)
Bicarbonate: 23.3 mmol/L (ref 20.0–28.0)
O2 SAT: 71 %
O2 SAT: 93 %
O2 SAT: 95 %
O2 SAT: 96 %
O2 Saturation: 100 %
O2 Saturation: 72 %
O2 Saturation: 82 %
O2 Saturation: 92 %
O2 Saturation: 98 %
PCO2 ART: 41.6 mmHg (ref 32.0–48.0)
PCO2 ART: 42.3 mmHg (ref 32.0–48.0)
PCO2 ART: 43 mmHg (ref 32.0–48.0)
PCO2 ART: 43.9 mmHg (ref 32.0–48.0)
PCO2 ART: 44.3 mmHg (ref 32.0–48.0)
PCO2 ART: 49.3 mmHg — AB (ref 32.0–48.0)
PH ART: 7.18 — AB (ref 7.350–7.450)
PH ART: 7.181 — AB (ref 7.350–7.450)
PH ART: 7.207 — AB (ref 7.350–7.450)
PH ART: 7.304 — AB (ref 7.350–7.450)
PH ART: 7.341 — AB (ref 7.350–7.450)
PH ART: 7.426 (ref 7.350–7.450)
PO2 ART: 69 mmHg — AB (ref 83.0–108.0)
PO2 ART: 69 mmHg — AB (ref 83.0–108.0)
PO2 ART: 98 mmHg (ref 83.0–108.0)
Patient temperature: 97.8
Patient temperature: 36
Patient temperature: 36.8
TCO2: 18 mmol/L (ref 0–100)
TCO2: 20 mmol/L (ref 0–100)
TCO2: 21 mmol/L (ref 0–100)
TCO2: 23 mmol/L (ref 0–100)
TCO2: 24 mmol/L (ref 0–100)
TCO2: 25 mmol/L (ref 0–100)
TCO2: 28 mmol/L (ref 0–100)
TCO2: 29 mmol/L (ref 0–100)
TCO2: 29 mmol/L (ref 0–100)
pCO2 arterial: 45 mmHg (ref 32.0–48.0)
pCO2 arterial: 49.4 mmHg — ABNORMAL HIGH (ref 32.0–48.0)
pCO2 arterial: 40.5 mmHg (ref 32.0–48.0)
pH, Arterial: 7.351 (ref 7.350–7.450)
pH, Arterial: 7.401 (ref 7.350–7.450)
pH, Arterial: 7.414 (ref 7.350–7.450)
pO2, Arterial: 173 mmHg — ABNORMAL HIGH (ref 83.0–108.0)
pO2, Arterial: 46 mmHg — ABNORMAL LOW (ref 83.0–108.0)
pO2, Arterial: 47 mmHg — ABNORMAL LOW (ref 83.0–108.0)
pO2, Arterial: 59 mmHg — ABNORMAL LOW (ref 83.0–108.0)
pO2, Arterial: 76 mmHg — ABNORMAL LOW (ref 83.0–108.0)
pO2, Arterial: 90 mmHg (ref 83.0–108.0)

## 2016-08-02 LAB — HEPARIN LEVEL (UNFRACTIONATED)
HEPARIN UNFRACTIONATED: 0.12 [IU]/mL — AB (ref 0.30–0.70)
Heparin Unfractionated: 0.11 IU/mL — ABNORMAL LOW (ref 0.30–0.70)

## 2016-08-02 LAB — BASIC METABOLIC PANEL
ANION GAP: 10 (ref 5–15)
ANION GAP: 10 (ref 5–15)
BUN: 34 mg/dL — AB (ref 6–20)
BUN: 34 mg/dL — ABNORMAL HIGH (ref 6–20)
CALCIUM: 6.3 mg/dL — AB (ref 8.9–10.3)
CO2: 23 mmol/L (ref 22–32)
CO2: 24 mmol/L (ref 22–32)
Calcium: 6.1 mg/dL — CL (ref 8.9–10.3)
Chloride: 96 mmol/L — ABNORMAL LOW (ref 101–111)
Chloride: 94 mmol/L — ABNORMAL LOW (ref 101–111)
Creatinine, Ser: 1.85 mg/dL — ABNORMAL HIGH (ref 0.61–1.24)
Creatinine, Ser: 1.94 mg/dL — ABNORMAL HIGH (ref 0.61–1.24)
GFR calc Af Amer: 41 mL/min — ABNORMAL LOW (ref >60)
GFR calc non Af Amer: 35 mL/min — ABNORMAL LOW (ref >60)
GFR calc non Af Amer: 33 mL/min — ABNORMAL LOW (ref >60)
GFR, EST AFRICAN AMERICAN: 39 mL/min — AB (ref >60)
Glucose, Bld: 152 mg/dL — ABNORMAL HIGH (ref 65–99)
Glucose, Bld: 115 mg/dL — ABNORMAL HIGH (ref 65–99)
POTASSIUM: 4.1 mmol/L (ref 3.5–5.1)
POTASSIUM: 4.6 mmol/L (ref 3.5–5.1)
Sodium: 129 mmol/L — ABNORMAL LOW (ref 135–145)
Sodium: 128 mmol/L — ABNORMAL LOW (ref 135–145)

## 2016-08-02 LAB — GLUCOSE, CAPILLARY
GLUCOSE-CAPILLARY: 364 mg/dL — AB (ref 65–99)
Glucose-Capillary: 151 mg/dL — ABNORMAL HIGH (ref 65–99)
Glucose-Capillary: 173 mg/dL — ABNORMAL HIGH (ref 65–99)
Glucose-Capillary: 114 mg/dL — ABNORMAL HIGH (ref 65–99)

## 2016-08-02 LAB — COOXEMETRY PANEL
CARBOXYHEMOGLOBIN: 1.3 % (ref 0.5–1.5)
METHEMOGLOBIN: 1 % (ref 0.0–1.5)
O2 Saturation: 69.5 %
Total hemoglobin: 9.3 g/dL — ABNORMAL LOW (ref 12.0–16.0)

## 2016-08-02 LAB — PROTIME-INR
INR: 1.24
PROTHROMBIN TIME: 15.7 s — AB (ref 11.4–15.2)

## 2016-08-02 LAB — URINE CULTURE
Culture: >=100000 — AB
Special Requests: NORMAL

## 2016-08-02 LAB — LACTIC ACID, PLASMA
LACTIC ACID, VENOUS: 4.5 mmol/L — AB (ref 0.5–1.9)
Lactic Acid, Venous: 3.6 mmol/L (ref 0.5–1.9)
Lactic Acid, Venous: 1.9 mmol/L (ref 0.5–1.9)

## 2016-08-02 LAB — PROCALCITONIN: Procalcitonin: 5.38 ng/mL

## 2016-08-02 LAB — CULTURE, RESPIRATORY W GRAM STAIN: Special Requests: NORMAL

## 2016-08-02 LAB — MAGNESIUM: MAGNESIUM: 2.7 mg/dL — AB (ref 1.7–2.4)

## 2016-08-02 LAB — APTT: aPTT: 51 seconds — ABNORMAL HIGH (ref 24–36)

## 2016-08-02 SURGERY — EXPLORATION, ARTERY, FEMORAL
Anesthesia: General | Site: Leg Lower | Laterality: Right

## 2016-08-02 MED ORDER — ROCURONIUM BROMIDE 100 MG/10ML IV SOLN
INTRAVENOUS | Status: DC | PRN
  Administered 2016-08-02: 40 mg via INTRAVENOUS
  Administered 2016-08-02: 30 mg via INTRAVENOUS

## 2016-08-02 MED ORDER — SODIUM CHLORIDE 0.9 % IV SOLN: INTRAVENOUS | Status: DC

## 2016-08-02 MED ORDER — FUROSEMIDE 10 MG/ML IJ SOLN
INTRAMUSCULAR | Status: AC
  Filled 2016-08-02: qty 16

## 2016-08-02 MED ORDER — SODIUM CHLORIDE 0.9% FLUSH: 3.0000 mL | INTRAVENOUS | Status: DC | PRN

## 2016-08-02 MED ORDER — HEPARIN (PORCINE) IN NACL 100-0.45 UNIT/ML-% IJ SOLN
750.0000 [IU]/h | INTRAMUSCULAR | Status: DC
  Filled 2016-08-02: qty 250

## 2016-08-02 MED ORDER — PROPOFOL 10 MG/ML IV BOLUS
INTRAVENOUS | Status: DC | PRN
  Administered 2016-08-02: 30 mg via INTRAVENOUS

## 2016-08-02 MED ORDER — SODIUM CHLORIDE 0.9 % IV SOLN
INTRAVENOUS | Status: DC | PRN
  Administered 2016-08-02: 2 mg/h via INTRAVENOUS

## 2016-08-02 MED ORDER — FUROSEMIDE 10 MG/ML IJ SOLN
80.0000 mg | Freq: Once | INTRAMUSCULAR | Status: AC
  Administered 2016-08-02: 80 mg via INTRAVENOUS
  Filled 2016-08-02: qty 8

## 2016-08-02 MED ORDER — HEPARIN SODIUM (PORCINE) 1000 UNIT/ML IJ SOLN
INTRAMUSCULAR | Status: DC | PRN
  Administered 2016-08-02: 9000 [IU] via INTRAVENOUS

## 2016-08-02 MED ORDER — MIDAZOLAM HCL 2 MG/2ML IJ SOLN
INTRAMUSCULAR | Status: AC
  Filled 2016-08-02: qty 2

## 2016-08-02 MED ORDER — SODIUM CHLORIDE 0.9 % IV SOLN
1.0000 g | Freq: Once | INTRAVENOUS | Status: AC
  Administered 2016-08-02: 1 g via INTRAVENOUS
  Filled 2016-08-02: qty 10

## 2016-08-02 MED ORDER — MILRINONE LACTATE IN DEXTROSE 20-5 MG/100ML-% IV SOLN
INTRAVENOUS | Status: DC | PRN
  Administered 2016-08-02: 0.5 ug/kg/min via INTRAVENOUS

## 2016-08-02 MED ORDER — METOLAZONE 2.5 MG PO TABS
2.5000 mg | ORAL_TABLET | Freq: Once | ORAL | Status: AC
  Administered 2016-08-02: 2.5 mg via ORAL
  Filled 2016-08-02: qty 1

## 2016-08-02 MED ORDER — FUROSEMIDE 10 MG/ML IJ SOLN
80.0000 mg | Freq: Three times a day (TID) | INTRAMUSCULAR | Status: DC
  Administered 2016-08-02 – 2016-08-03 (×2): 80 mg via INTRAVENOUS
  Filled 2016-08-02 (×2): qty 8

## 2016-08-02 MED ORDER — PROPOFOL 10 MG/ML IV BOLUS
INTRAVENOUS | Status: AC
  Filled 2016-08-02: qty 20

## 2016-08-02 MED ORDER — SODIUM CHLORIDE 0.9 % IV SOLN
1.0000 g | Freq: Once | INTRAVENOUS | Status: AC
  Administered 2016-08-02: 1 g via INTRAVENOUS
  Filled 2016-08-02 (×2): qty 10

## 2016-08-02 MED ORDER — HEPARIN SODIUM (PORCINE) 1000 UNIT/ML IJ SOLN
INTRAMUSCULAR | Status: AC
  Filled 2016-08-02: qty 1

## 2016-08-02 MED ORDER — SODIUM CHLORIDE 0.9 % IV SOLN
INTRAVENOUS | Status: DC | PRN
  Administered 2016-08-02: 12:00:00 500 mL

## 2016-08-02 MED ORDER — EPINEPHRINE PF 1 MG/ML IJ SOLN
INTRAMUSCULAR | Status: DC | PRN
  Administered 2016-08-02: 8 ug/min via INTRAVENOUS

## 2016-08-02 MED ORDER — ACETAMINOPHEN 325 MG PO TABS
650.0000 mg | ORAL_TABLET | ORAL | Status: DC | PRN
  Administered 2016-08-02: 650 mg via ORAL
  Filled 2016-08-02: qty 2

## 2016-08-02 MED ORDER — PHENYLEPHRINE HCL 10 MG/ML IJ SOLN
INTRAMUSCULAR | Status: DC | PRN
  Administered 2016-08-02: 80 ug via INTRAVENOUS

## 2016-08-02 MED ORDER — FENTANYL CITRATE (PF) 250 MCG/5ML IJ SOLN
INTRAMUSCULAR | Status: AC
  Filled 2016-08-02: qty 5

## 2016-08-02 MED ORDER — VASOPRESSIN 20 UNIT/ML IV SOLN
0.0300 [IU]/min | INTRAVENOUS | Status: DC
  Administered 2016-08-02 – 2016-08-04 (×3): 0.03 [IU]/min via INTRAVENOUS
  Filled 2016-08-02 (×3): qty 2

## 2016-08-02 MED ORDER — ONDANSETRON HCL 4 MG/2ML IJ SOLN: 4.0000 mg | Freq: Four times a day (QID) | INTRAMUSCULAR | Status: DC | PRN

## 2016-08-02 MED ORDER — SODIUM CHLORIDE 0.9 % IV SOLN
INTRAVENOUS | Status: DC | PRN
  Administered 2016-08-02: 250 ug/h via INTRAVENOUS

## 2016-08-02 MED ORDER — HEPARIN (PORCINE) IN NACL 100-0.45 UNIT/ML-% IJ SOLN: 750.0000 [IU]/h | INTRAMUSCULAR | Status: DC

## 2016-08-02 MED ORDER — HEPARIN (PORCINE) IN NACL 100-0.45 UNIT/ML-% IJ SOLN: 850.0000 [IU]/h | INTRAMUSCULAR | Status: DC

## 2016-08-02 MED ORDER — PHENYLEPHRINE 40 MCG/ML (10ML) SYRINGE FOR IV PUSH (FOR BLOOD PRESSURE SUPPORT)
PREFILLED_SYRINGE | INTRAVENOUS | Status: AC
  Filled 2016-08-02: qty 10

## 2016-08-02 MED ORDER — SODIUM CHLORIDE 0.9% FLUSH
3.0000 mL | Freq: Two times a day (BID) | INTRAVENOUS | Status: DC
  Administered 2016-08-02 (×3): 3 mL via INTRAVENOUS

## 2016-08-02 MED ORDER — SODIUM CHLORIDE 0.9 % IV SOLN
250.0000 mL | INTRAVENOUS | Status: DC | PRN
  Administered 2016-08-02: 11:00:00 via INTRAVENOUS

## 2016-08-02 MED ORDER — 0.9 % SODIUM CHLORIDE (POUR BTL) OPTIME
TOPICAL | Status: DC | PRN
  Administered 2016-08-02: 1000 mL

## 2016-08-02 SURGICAL SUPPLY — 69 items
BANDAGE ACE 4X5 VEL STRL LF (GAUZE/BANDAGES/DRESSINGS) IMPLANT
BANDAGE ESMARK 6X9 LF (GAUZE/BANDAGES/DRESSINGS) IMPLANT
BNDG ESMARK 6X9 LF (GAUZE/BANDAGES/DRESSINGS)
CANISTER SUCT 3000ML PPV (MISCELLANEOUS) ×4 IMPLANT
CANNULA VESSEL 3MM 2 BLNT TIP (CANNULA) ×8 IMPLANT
CANNULA VESSEL W/WING W/VALVE (CANNULA) ×4 IMPLANT
CLIP TI MEDIUM 24 (CLIP) ×4 IMPLANT
CLIP TI WIDE RED SMALL 24 (CLIP) ×4 IMPLANT
CONNECTOR Y ATS VAC SYSTEM (MISCELLANEOUS) ×4 IMPLANT
CUFF TOURNIQUET SINGLE 24IN (TOURNIQUET CUFF) IMPLANT
CUFF TOURNIQUET SINGLE 34IN LL (TOURNIQUET CUFF) IMPLANT
CUFF TOURNIQUET SINGLE 44IN (TOURNIQUET CUFF) IMPLANT
DERMABOND ADVANCED (GAUZE/BANDAGES/DRESSINGS) ×2
DERMABOND ADVANCED .7 DNX12 (GAUZE/BANDAGES/DRESSINGS) ×2 IMPLANT
DRAIN CHANNEL 15F RND FF W/TCR (WOUND CARE) IMPLANT
DRAIN PENROSE 3/4X12 (DRAIN) IMPLANT
DRAIN WOUND SNY 15 RND (WOUND CARE) ×4 IMPLANT
DRAPE X-RAY CASS 24X20 (DRAPES) IMPLANT
DRSG COVADERM 4X8 (GAUZE/BANDAGES/DRESSINGS) ×4 IMPLANT
DRSG VAC ATS MED SENSATRAC (GAUZE/BANDAGES/DRESSINGS) ×4 IMPLANT
ELECT REM PT RETURN 9FT ADLT (ELECTROSURGICAL) ×4
ELECTRODE REM PT RTRN 9FT ADLT (ELECTROSURGICAL) ×2 IMPLANT
EVACUATOR SILICONE 100CC (DRAIN) IMPLANT
GAUZE SPONGE 2X2 8PLY STRL LF (GAUZE/BANDAGES/DRESSINGS) ×2 IMPLANT
GLOVE BIO SURGEON STRL SZ 6.5 (GLOVE) ×6 IMPLANT
GLOVE BIO SURGEON STRL SZ7.5 (GLOVE) ×8 IMPLANT
GLOVE BIO SURGEONS STRL SZ 6.5 (GLOVE) ×2
GLOVE BIOGEL PI IND STRL 6.5 (GLOVE) ×2 IMPLANT
GLOVE BIOGEL PI IND STRL 7.0 (GLOVE) ×2 IMPLANT
GLOVE BIOGEL PI IND STRL 7.5 (GLOVE) ×4 IMPLANT
GLOVE BIOGEL PI IND STRL 8 (GLOVE) ×2 IMPLANT
GLOVE BIOGEL PI INDICATOR 6.5 (GLOVE) ×2
GLOVE BIOGEL PI INDICATOR 7.0 (GLOVE) ×2
GLOVE BIOGEL PI INDICATOR 7.5 (GLOVE) ×4
GLOVE BIOGEL PI INDICATOR 8 (GLOVE) ×2
GLOVE SURG SS PI 7.5 STRL IVOR (GLOVE) ×4 IMPLANT
GOWN STRL REUS W/ TWL LRG LVL3 (GOWN DISPOSABLE) ×8 IMPLANT
GOWN STRL REUS W/TWL LRG LVL3 (GOWN DISPOSABLE) ×8
KIT BASIN OR (CUSTOM PROCEDURE TRAY) ×4 IMPLANT
KIT PREVENA INCISION MGT 13 (CANNISTER) ×4 IMPLANT
KIT ROOM TURNOVER OR (KITS) ×4 IMPLANT
MARKER GRAFT CORONARY BYPASS (MISCELLANEOUS) IMPLANT
NS IRRIG 1000ML POUR BTL (IV SOLUTION) ×8 IMPLANT
PACK PERIPHERAL VASCULAR (CUSTOM PROCEDURE TRAY) ×4 IMPLANT
PAD ARMBOARD 7.5X6 YLW CONV (MISCELLANEOUS) ×8 IMPLANT
SET COLLECT BLD 21X3/4 12 (NEEDLE) IMPLANT
SNARE SHORT THROW 13M SML OVAL (MISCELLANEOUS) ×4 IMPLANT
SPONGE GAUZE 2X2 STER 10/PKG (GAUZE/BANDAGES/DRESSINGS) ×2
SPONGE SURGIFOAM ABS GEL 100 (HEMOSTASIS) IMPLANT
STAPLER VISISTAT (STAPLE) IMPLANT
STOPCOCK 4 WAY LG BORE MALE ST (IV SETS) IMPLANT
SUT ETHILON 3 0 PS 1 (SUTURE) ×4 IMPLANT
SUT PROLENE 5 0 C 1 24 (SUTURE) ×4 IMPLANT
SUT PROLENE 6 0 BV (SUTURE) ×4 IMPLANT
SUT PROLENE 7 0 BV 1 (SUTURE) IMPLANT
SUT SILK 2 0 FS (SUTURE) IMPLANT
SUT SILK 3 0 (SUTURE)
SUT SILK 3-0 18XBRD TIE 12 (SUTURE) IMPLANT
SUT VIC AB 2-0 CTB1 (SUTURE) ×4 IMPLANT
SUT VIC AB 3-0 SH 27 (SUTURE) ×2
SUT VIC AB 3-0 SH 27X BRD (SUTURE) ×2 IMPLANT
SUT VICRYL 4-0 PS2 18IN ABS (SUTURE) ×4 IMPLANT
SYR BULB IRRIGATION 50ML (SYRINGE) ×4 IMPLANT
TAPE CLOTH SURG 4X10 WHT LF (GAUZE/BANDAGES/DRESSINGS) ×4 IMPLANT
TRAY FOLEY W/METER SILVER 16FR (SET/KITS/TRAYS/PACK) IMPLANT
TUBING EXTENTION W/L.L. (IV SETS) IMPLANT
UNDERPAD 30X30 (UNDERPADS AND DIAPERS) ×4 IMPLANT
WATER STERILE IRR 1000ML POUR (IV SOLUTION) ×4 IMPLANT
WND VAC CANISTER 500ML (MISCELLANEOUS) ×4 IMPLANT

## 2016-08-02 NOTE — Progress Notes (Signed)
PULMONARY / CRITICAL CARE MEDICINE   Name: Jay Livingston MRN: [id number] DOB: 05/20/1945    ADMISSION DATE:  07/12/2016 CONSULTATION DATE:  07/19/2016  REFERRING MD:  Eldridge Dace  CHIEF COMPLAINT:  Cardiac Arrest  HISTORY OF PRESENT ILLNESS:  Pt is encephelopathic; therefore, this HPI is obtained from chart review. Jay Livingston is a 71 y.o. male with PMH as outlined below. On morning of 07/31/2016, he was at work sitting at his desk and was found unresponsive with agonal respirations. He was seen roughly 5 minutes prior to this and was normal.  He had 10 minutes of downtime and when EMS arrived, he was found to be in VT without a pulse.  ACLS was performed for 22 minutes before ROSC.  He was brought to [facility] ED where he was intubated and taken to the cath lab emergently - found to have 3VD without interventions, Impella placed  PCCM was asked to assist with vent management.  SUBJECTIVE:   Eventful 24 hrs Had to get IABP placed due to worsening shock, recurrent VTs CXR is worse with poor oxygenation. Went to OR for exploration of right groin as he has ischemic rt LE  VITAL SIGNS: BP (!) 101/43   Pulse 97   Temp 98.1 F (36.7 C)   Resp 18   Ht 5\' 10"  (1.778 m)   Wt 228 lb 2.8 oz (103.5 kg)   SpO2 100%   BMI 32.74 kg/m   HEMODYNAMICS: PAP: (39-48)/(21-29) 42/23 CVP:  [10 mmHg-23 mmHg] 15 mmHg PCWP:  [21 mmHg] 21 mmHg CO:  [5.5 L/min-7.5 L/min] 7.5 L/min CI:  [2.5 L/min/m2-3.1 L/min/m2] 3.1 L/min/m2  VENTILATOR SETTINGS: Vent Mode: PCV FiO2 (%):  [70 %-100 %] 70 % Set Rate:  [18 bmp] 18 bmp Vt Set:  [550 mL] 550 mL PEEP:  [5 cmH20-16 cmH20] 16 cmH20 Plateau Pressure:  [17 cmH20-32 cmH20] 29 cmH20  INTAKE / OUTPUT: I/O last 3 completed shifts: In: 7663.6 [I.V.:5920.3; NG/GT:1543.3; IV Piggyback:200] Out: 4145 [Urine:3345; Emesis/NG output:800]  PHYSICAL EXAMINATION: Gen:      No acute distress HEENT:  EOMI, sclera anicteric, ETT  In place Neck:     No masses; no  thyromegaly Lungs:    Clear to auscultation bilaterally; normal respiratory effort CV:         Regular rate and rhythm; no murmurs Abd:      + bowel sounds; soft, non-tender; no palpable masses, no distension Ext:    No edema; Cold extremities. Rt LE and groin wound vac in place. Lt groin IABP Neuro: Jay Livingston  LABS:  BMET  Recent Labs Lab 07/31/2016 1913 08/28/2016 0242 08/12/2016 0827  NA 125* 130* 129*  K 5.7* 4.5 4.1  CL 97* 95* 96*  CO2 21* 24 23  BUN 29* 32* 34*  CREATININE 1.78* 1.93* 1.85*  GLUCOSE 350* 200* 152*    Electrolytes  Recent Labs Lab 07/30/16 0501 07/31/16 0403  07/30/2016 1913 08/29/2016 0242 08/29/2016 0827  CALCIUM 7.4* 6.5*  < > 6.4* 6.7* 6.3*  MG 2.4 1.7  --  1.8 2.7*  --   PHOS 1.4* 4.8*  --  5.4*  --   --   < > = values in this interval not displayed.  CBC  Recent Labs Lab 07/06/2016 0329 08/16/2016 0242 08/25/2016 0827  WBC 20.0* 11.4* 9.5  HGB 11.1* 8.9* 8.1*  HCT 33.5* 26.1* 24.1*  PLT 147* 111* 94*    Coag's  Recent Labs Lab 07/18/2016 1105 07/14/2016 2128 08/16/2016 0612  APTT 31  63* 51*  INR 1.09 1.18 1.24    Sepsis Markers  Recent Labs Lab 2016/11/08 2126 08/29/2016 0242 08/28/2016 0310 08/18/2016 0612  LATICACIDVEN 3.2* 4.5*  --  3.6*  PROCALCITON  --   --  5.38  --     ABG  Recent Labs Lab 08/19/2016 0045 08/03/2016 0336 08/03/2016 0612  PHART 7.341* 7.351 7.426  PCO2ART 41.6 42.3 40.5  PO2ART 69.0* 90.0 173.0*    Liver Enzymes  Recent Labs Lab 07/31/16 1313 2016/11/08 0329 08/28/2016 0242  AST 75* 63* 84*  ALT 61 56 51  ALKPHOS 46 54 52  BILITOT 1.2 1.0 1.2  ALBUMIN 2.4* 2.4* 1.9*    Cardiac Enzymes  Recent Labs Lab 07/11/2016 1839 07/30/16 0309 07/30/16 0501  TROPONINI 2.64* 1.98* 1.69*    Glucose  Recent Labs Lab 07/31/16 2358 2016/11/08 0334 2016/11/08 0750 2016/11/08 1626 2016/11/08 2047 08/08/2016 0739  GLUCAP 155* 135* 172* 266* 364* 151*    Imaging Dg Chest Port 1 View  Result Date:  09/01/2016 CLINICAL DATA:  71 y/o  M; aortic balloon pump. EXAM: PORTABLE CHEST 1 VIEW COMPARISON:  08/26/2016 chest radiograph. FINDINGS: Interval diminution in size of cardiac silhouette. Perihilar opacities and interstitial prominence compatible pulmonary edema is also mildly decreased. Aortic balloon pump projects just below the aortic arch over the descending thoracic aorta. Endotracheal tube 3.9 cm from carina. Left central venous catheter tip projects over upper SVC. No acute osseous abnormality is evident. IMPRESSION: 1. Interval diminution in heart size and mild decrease in pulmonary edema. 2. Aortic balloon pump projects within descending thoracic aorta just below level of aortic knob. Electronically Signed   By: Mitzi HansenLance  Furusawa-Stratton M.D.   On: 08/20/2016 01:04   Dg Chest Port 1 View  Result Date: Jun 02, 2016 CLINICAL DATA:  Desaturation, shortness of breath EXAM: PORTABLE CHEST 1 VIEW COMPARISON:  07/31/2016 FINDINGS: Endotracheal tube tip approximately 1 cm superior to the carina. Esophageal tube tip is in the left upper quadrant. Cardiomegaly with increased perihilar opacities suggestive of edema. Small pleural effusions bilaterally, increased on the right side. No pneumothorax. Left-sided central venous catheter tip directed to the patient's right over the SVC confluence IMPRESSION: 1. Small bilateral pleural effusions, increased on the right 2. Cardiomegaly with worsening of perihilar airspace opacities suspicious for pulmonary edema. Electronically Signed   By: Jasmine PangKim  Fujinaga M.D.   On: 08/26/2016 17:59   STUDIES:  CXR 6/27 >  Cath 6/27 >  Echo 6/27 > ef 10% EEG 6/27 >   CULTURES: Ucc 6/29 >> E fecalis Bcx 6/29>> Resp Cx 7/1 >>  ANTIBIOTICS: Vanco 6/30>  Zosyn 6/30 >  SIGNIFICANT EVENTS: 6/27 Admit, Hypothermia protocol 6/29 Impella removed 6/30 IABP placed 7/1 Rt LE fasciotomy and enarterectomy  LINES/TUBES: ETT 6/27 >  6/27 LIJ cvl >> Rt femoral impella>> removed  6/29 Lt IABP 6/30 >>  DISCUSSION: 71 y.o. male admitted 6/27 after VT cardiac arrest. Had 10 minutes downtime before ACLS started then 22 minutes prior to ROSC.  Intubated and taken to cath lab emergently. VT again post cath 6/27 and Amio drip  ASSESSMENT / PLAN:  PULMONARY A: Respiratory insufficiency - following VT cardiac arrest. P:   Full vent support Wean PEEP, FiO2 as tolerated Deep sedation. Consider re initiation of paralytics Recheck CXR, ABG  CARDIOVASCULAR A:  VT arrest - s/p emergent cath 6/27 with 3VD s/p impella placement. VT again 6/27 amio drip started Impella per cards P:  Post procedural management per cardiology. Continue epi, vaso, milrinone, amio  gtt IABP  RENAL A:   AKI P:   Follow urine output and Cr  GASTROINTESTINAL A:   GI prophylaxis. Nutrition. P:   Protonix for SUP. Hold tube feeds  HEMATOLOGIC A:   VTE Prophylaxis. At risk for hypothermia induced coagulopathy. P:  Follow CBC  INFECTIOUS A:   E fecalis UTI Concern for HCAP P:   Continue vanc, zosyn Follow Cx, PCt  ENDOCRINE  A:   Hyperglycemia P:   On SSI coverage  NEUROLOGIC A:   Acute encephalopathy Following commands after rewarming P:   Fentanyl gtt   Family updated: No family at bedside 6/30 Interdisciplinary Family Meeting v Palliative Care Meeting:  Due by: 7ds  The patient is critically ill with multiple organ system failure and requires high complexity decision making for assessment and support, frequent evaluation and titration of therapies, advanced monitoring, review of radiographic studies and interpretation of complex data.   Critical Care Time devoted to patient care services, exclusive of separately billable procedures, described in this note is 40 minutes.   Chilton Greathouse MD [facility] Pager [phone] If no answer or after 3pm call: [phone] 08/13/2016, 1:29 PM

## 2016-08-02 NOTE — Plan of Care (Signed)
Problem: Physical Regulation: Goal: Ability to maintain clinical measurements within normal limits will improve Outcome: Not Progressing Hemodynamically unstable  Problem: Fluid Volume: Goal: Ability to maintain a balanced intake and output will improve Outcome: Not Progressing Not responding to lasix

## 2016-08-02 NOTE — Progress Notes (Signed)
VASCULAR SURGERY ADDENDUM:  Duplex scan shows no flow in the right common femoral artery or proximal superficial femoral artery. Without attempted exploration of the right groin, having a profoundly ischemic right lower extremity will significantly complicate his already complicated care. After extensive discussion with Dr. Gala RomneyBensimhon and Dr. Noreene LarssonJoslin, we feel that the only potential option is exploration of the right groin in hopes of finding a local problem related to the ventricular assist device catheter. If there is an inflow problem I do not think that he is a candidate for an intervention in the iliac system given that he may have had an injury to the artery (given his history of weakness in the right lower extremity and possible retroperitoneal hematoma). I have discussed the situation with the family and we agreed we will simply explore the right groin in hopes of finding something local but there is really nothing further that we can do at this point. They understand that there is significant risk with surgery and even transporting the patient to the operating room. They are agreeable to proceed.  Waverly Ferrarihristopher Ivor Kishi, MD, FACS Beeper [phone] Office: [phone]

## 2016-08-02 NOTE — Progress Notes (Signed)
VASCULAR LAB PRELIMINARY  PRELIMINARY  PRELIMINARY  PRELIMINARY  Right limited lower extremity arterial duplex completed.    Preliminary report:  There is minimal spiked flow noted in the right common femoral artery and no flow noted in the proximal femoral artery, by duplex imaging.   Irelynd Zumstein, RVT 08/21/2016, 9:56 AM

## 2016-08-02 NOTE — Progress Notes (Addendum)
ANTICOAGULATION CONSULT NOTE - Initial Consult  Pharmacy Consult for heparin Indication: IABP placement   No Known Allergies  Patient Measurements: Height: 5\' 10"  (177.8 cm) Weight: 228 lb 2.8 oz (103.5 kg) IBW/kg (Calculated) : 73 Heparin Dosing Weight: 95 kg  Vital Signs: Temp: 99.3 F (37.4 C) (06/30 2140) Temp Source: Axillary (06/30 2103) BP: 110/51 (06/30 2230) Pulse Rate: 95 (06/30 2230)  Labs:  Recent Labs  07/30/16 0309  07/30/16 0501  07/31/16 0403  07/31/16 2200 07/17/2016 0329 07/12/2016 1913  HGB  --   --  13.0  < > 13.4  --  11.2* 11.1*  --   HCT  --   --  38.5*  < > 39.9  --  33.7* 33.5*  --   PLT  --   < > 173  --  166  --  144* 147*  --   CREATININE 0.83  --  0.79  --  1.24  < > 1.57* 1.63* 1.78*  TROPONINI 1.98*  --  1.69*  --   --   --   --   --   --   < > = values in this interval not displayed.  Estimated Creatinine Clearance: 46.5 mL/min (A) (by C-G formula based on SCr of 1.78 mg/dL (H)).   Medical History: Past Medical History:  Diagnosis Date  . Medical history non-contributory    per family does not see a doctor, is not on any medications    [name] to start heparin after placement of IABP. Per consult start heparin infusion at 600 units/hr due to difficult arterial access and run at this rate overnight. Plan to increase to therapeutic heparin later this morning.   Goal of Therapy:  Heparin level: 0.2 -0.5 units/ml Monitor platelets by anticoagulation protocol: Yes   Plan:  1. Begin heparin infusion at 600 units/hr 2. Heparin level in 8 hours 3. Follow up with heart failure team in am to determine when ok to use therapeutic heparin    Pollyann SamplesAndy Doryan Bahl, PharmD, BCPS 08/27/2016, 12:04 AM

## 2016-08-02 NOTE — Progress Notes (Signed)
[name] were having difficulty oxygenating and ventilating him.   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 Since then, Impella was removed on 6/29 due to foot becoming cold. Patient underwent therapeutic hypothermia and is in the rewarming phase. Hospital course was also complicated by VT again on 6/27 for which Amio gtt was started. Course complicated by cardiogenic shock requiring multiple vasopressors. He also has AKI with Hyperkalemia and Hyponatremia. Earlier this evening (6/30 @ 5:18pm), patient reportedly had Torsades and received 1 shock. RT noted significant vent asynchrony [facility] Cards note reports vasopressor requirements increased over the course of the day. CXR showed worsening b/l pulmonary infiltrates. Later in the evening he had another episode of pulseless VT and was shocked once again. Patient taken to cath lab and IABP placed. Post procedure, CXR shows pulmonary infiltrates are already improved. Patient is being diuresed.   Exam: 98.6, 15, 97, 5/62, 98%, CVP 12 Vent: ACPC, RR 18, 22/16, 100%, Vt 817-900, MV 15.5-16 Vasopressors: Epi @ 4.5, Milrinone @ 0.5, (levo off since 11pm) Sedation: fentanyl @ 200mcg Other infusions: Bicarb (150MEQ) @ 75cc/hr, Amio gtt, Heparin gtt IV Access: LIJ TLC, Left radial Aline, LUE 18G PIV, RUE 20G PIV, Left femoral swan ganz/IABP/Sheath Intubated, sedated, critically ill, arctic sun pads in place (normothermic) ETT with thick creamy yellow secretions to suction CTA b/l RRR, IABP mechanical sounds Abd soft obese BLE cold and mottled/blue with no pedal pulses on doppler  Labs: Na 125, K 5.7, BUN 29, Cr 1.78 (up from 1.32 on admit), HCO2 21 WBC 20, Hgb 11, Plt 147 Lactate (6/20 @ 2126):  3.2 CXR: on my review shows improvement of the still significant b/l pulmonary infiltrates; ETT correct position; IABP in place  A/P:  1. Cardiogenic shock; CAD: - continue IABP and Vasopressors - CVP most recently was 12  2. VT Cardiac arrest - continue amiodarone; unclear if the VT tonight was due to hypoxia or worsening shock or hyperkalemia  3. Acute hypoxic respiratory failure; Pulmonary Edema; Aspiration pneumonia - likely a combination of pulmonary edema (since is improved so rapidly with IABP and diuresis) as well as pneumonia (thick yellow creamy secretions from ETT) - obtain new sputum culture - continue Vanc and Zosyn - currently on AC/PC which he is tolerating well, but as pulmonary edema improves and lung compliance improves, we may begin overventilating and over-distending him. However reportedly he is not tolerating VC or PRVC now due to vent asynchrony - repeat ABG now shows 7.35/42/90/23/96%; no vent changes made  4. UTI: - urine culture growing >100k Enterococcus faecalis; sensitivities pending - continue antibiotics as above  5. AKI; Hyperkalemia; Hyponatremia: - UOP still adequate (50-80cc/hr); is likely due to ATN from the shock - Agree with continuing diuresis; may be reasonable to consult Nephrology in the AM if not improving, although not sure he is stable enough to tolerate even CRRT.  6. Acute encephalopathy: - reportedly was following commands earlier today, but since returning from the cath lab is now not following commands - at some point may want to obtain Head CT; he is too unstable to leave the floor for CT now though.     60 minutes critical care time  Jay ObeyKathleen Bree Heinzelman, MD Pulmonary & Critical Care

## 2016-08-02 NOTE — Anesthesia Preprocedure Evaluation (Signed)
Anesthesia Evaluation  Patient identified by MRN, date of birth, ID band Patient awake    Reviewed: NPO status , Patient's Chart, lab work & pertinent test results, Unable to perform ROS - Chart review only  Airway Mallampati: Intubated       Dental   Pulmonary former smoker,    + rhonchi  + decreased breath sounds      Cardiovascular  Rhythm:Regular Rate:Tachycardia     Neuro/Psych    GI/Hepatic   Endo/Other    Renal/GU      Musculoskeletal   Abdominal   Peds  Hematology   Anesthesia Other Findings   Reproductive/Obstetrics                             Anesthesia Physical Anesthesia Plan  ASA: IV and emergent  Anesthesia Plan: General   Post-op Pain Management:    Induction: Intravenous  PONV Risk Score and Plan: Ondansetron and Midazolam  Airway Management Planned: Oral ETT  Additional Equipment:   Intra-op Plan:   Post-operative Plan: Post-operative intubation/ventilation  Informed Consent: I have reviewed the patients History and Physical, chart, labs and discussed the procedure including the risks, benefits and alternatives for the proposed anesthesia with the patient or authorized representative who has indicated his/her understanding and acceptance.     Plan Discussed with: CRNA and Anesthesiologist  Anesthesia Plan Comments:         Anesthesia Quick Evaluation

## 2016-08-02 NOTE — Anesthesia Postprocedure Evaluation (Signed)
Anesthesia Post Note  Patient: Jay Livingston  Procedure(s) Performed: Procedure(s) (LRB): Right Femoral Artery Exploration with Thrombectomy Right Iliac Artery (Right) Right Common Femoral Endarterectomy with Vein Patch Angioplasty. (Right) Four compartment Fasciotomy Right Lower Leg (Right)     Patient location during evaluation: SICU Anesthesia Type: General Level of consciousness: sedated and patient remains intubated per anesthesia plan Pain management: pain level controlled Respiratory status: patient remains intubated per anesthesia plan and patient on ventilator - see flowsheet for VS Cardiovascular status: blood pressure returned to baseline Anesthetic complications: no    Last Vitals:  Vitals:   08/09/2016 2030 08/11/2016 2045  BP:    Pulse: (!) 266 (!) 213  Resp: 18 18  Temp: 36.9 C 36.9 C    Last Pain:  Vitals:   08/27/2016 1600  TempSrc: Core (Comment)                 Camerin Jimenez COKER

## 2016-08-02 NOTE — Progress Notes (Signed)
Advanced Heart Failure Rounding Note  PCP:  Primary Cardiologist:   Subjective:    Impella pulled 6/29 due to inability to move R leg.  Last night developed recurrent VT/VF receiving several shocks. Developed progressive shock, acidosis and hypoxemia despite increasing pressors.   RLE appearing progressively ischemic with vasoconstriction from pressors.  He was taken emergently to cath lab for left femoral IABP and swan as a salvage attempt.   Remains on ventilator. Oxygenation improving after diuresis. Intermittently awake. On epi 7 and milrinone 0.5 with MAP ~70  ABG 7.4/41/173/100% on 70%  Lactate 3.6  Swan  CVP 15 PA 43/25 (31) CO/CI 7.5/3.3 SVR 480   Objective:   Weight Range: 103.5 kg (228 lb 2.8 oz) Body mass index is 32.74 kg/m.   Vital Signs:   Temp:  [97.3 F (36.3 C)-99.3 F (37.4 C)] 98.1 F (36.7 C) (07/01 0715) Pulse Rate:  [0-295] 99 (07/01 0339) Resp:  [0-59] 18 (07/01 0715) BP: (79-176)/(38-96) 93/38 (07/01 0800) SpO2:  [0 %-100 %] 100 % (07/01 0715) FiO2 (%):  [60 %-100 %] 80 % (07/01 0645) Last BM Date:  (PTA)  Weight change: Filed Weights   07/30/16 0500 07/31/16 0500 07/12/2016 0400  Weight: 91.4 kg (201 lb 8 oz) 98 kg (216 lb 0.8 oz) 103.5 kg (228 lb 2.8 oz)    Intake/Output:   Intake/Output Summary (Last 24 hours) at 08/10/2016 0850 Last data filed at 08/29/2016 0700  Gross per 24 hour  Intake          5189.09 ml  Output             3390 ml  Net          1799.09 ml      Physical Exam   General:  Intubated. Awake at times. Critically ill appearing HEENT: normal ETT Neck: supple. RIJ TLC  Carotids 2+ bilat; no bruits. No lymphadenopathy or thryomegaly appreciated. Cor: PMI no palpable. Regular rate & rhythm. No rubs, gallops or murmurs aprreciated Lungs: rhonchorous Abdomen: soft, nontender, ++ distended. Hypoactive BS Extremities: R groin small hematoma. +echymosis. RLE cold and mottled no dopplerable pulses. Left groin  with IABP and Swan. Old ecchymosis Neuro: Sedated awakens at times  Telemetry   NSR with PVCs Personally reviewed   EKG    N/A   Labs    CBC  Recent Labs  07/27/2016 0329 08/03/2016 0242  WBC 20.0* 11.4*  HGB 11.1* 8.9*  HCT 33.5* 26.1*  MCV 93.3 91.3  PLT 147* 915*   Basic Metabolic Panel  Recent Labs  07/31/16 0403  07/27/2016 1913 08/06/2016 0242  NA 130*  < > 125* 130*  K 5.8*  < > 5.7* 4.5  CL 105  < > 97* 95*  CO2 18*  < > 21* 24  GLUCOSE 156*  < > 350* 200*  BUN 13  < > 29* 32*  CREATININE 1.24  < > 1.78* 1.93*  CALCIUM 6.5*  < > 6.4* 6.7*  MG 1.7  --  1.8 2.7*  PHOS 4.8*  --  5.4*  --   < > = values in this interval not displayed. Liver Function Tests  Recent Labs  07/15/2016 0329 08/31/2016 0242  AST 63* 84*  ALT 56 51  ALKPHOS 54 52  BILITOT 1.0 1.2  PROT 5.1* 4.6*  ALBUMIN 2.4* 1.9*   No results for input(s): LIPASE, AMYLASE in the last 72 hours. Cardiac Enzymes No results for input(s): CKTOTAL, CKMB, CKMBINDEX, TROPONINI in  the last 72 hours.  BNP: BNP (last 3 results) No results for input(s): BNP in the last 8760 hours.  ProBNP (last 3 results) No results for input(s): PROBNP in the last 8760 hours.   D-Dimer No results for input(s): DDIMER in the last 72 hours. Hemoglobin A1C No results for input(s): HGBA1C in the last 72 hours. Fasting Lipid Panel No results for input(s): CHOL, HDL, LDLCALC, TRIG, CHOLHDL, LDLDIRECT in the last 72 hours. Thyroid Function Tests No results for input(s): TSH, T4TOTAL, T3FREE, THYROIDAB in the last 72 hours.  Invalid input(s): FREET3  Other results:   Imaging    Dg Chest Port 1 View  Result Date: 08/04/2016 CLINICAL DATA:  71 y/o  M; aortic balloon pump. EXAM: PORTABLE CHEST 1 VIEW COMPARISON:  07/15/2016 chest radiograph. FINDINGS: Interval diminution in size of cardiac silhouette. Perihilar opacities and interstitial prominence compatible pulmonary edema is also mildly decreased. Aortic balloon  pump projects just below the aortic arch over the descending thoracic aorta. Endotracheal tube 3.9 cm from carina. Left central venous catheter tip projects over upper SVC. No acute osseous abnormality is evident. IMPRESSION: 1. Interval diminution in heart size and mild decrease in pulmonary edema. 2. Aortic balloon pump projects within descending thoracic aorta just below level of aortic knob. Electronically Signed   By: Kristine Garbe M.D.   On: 08/29/2016 01:04   Dg Chest Port 1 View  Result Date: 07/20/2016 CLINICAL DATA:  Desaturation, shortness of breath EXAM: PORTABLE CHEST 1 VIEW COMPARISON:  07/31/2016 FINDINGS: Endotracheal tube tip approximately 1 cm superior to the carina. Esophageal tube tip is in the left upper quadrant. Cardiomegaly with increased perihilar opacities suggestive of edema. Small pleural effusions bilaterally, increased on the right side. No pneumothorax. Left-sided central venous catheter tip directed to the patient's right over the SVC confluence IMPRESSION: 1. Small bilateral pleural effusions, increased on the right 2. Cardiomegaly with worsening of perihilar airspace opacities suspicious for pulmonary edema. Electronically Signed   By: Donavan Foil M.D.   On: 07/22/2016 17:59     Medications:     Scheduled Medications: . aspirin  81 mg Oral Daily  . chlorhexidine gluconate (MEDLINE KIT)  15 mL Mouth Rinse BID  . Chlorhexidine Gluconate Cloth  6 each Topical Daily  . feeding supplement (PRO-STAT SUGAR FREE 64)  30 mL Per Tube 5 X Daily  . feeding supplement (VITAL HIGH PROTEIN)  1,000 mL Per Tube Q24H  . fentaNYL (SUBLIMAZE) injection  50 mcg Intravenous Once  . insulin aspart  0-20 Units Subcutaneous Q4H  . insulin aspart  1 Units Subcutaneous Q4H  . insulin glargine  10 Units Subcutaneous Daily  . mouth rinse  15 mL Mouth Rinse 10 times per day  . mupirocin ointment  1 application Nasal BID  . pantoprazole sodium  40 mg Per Tube Daily  . sodium  chloride flush  10-40 mL Intracatheter Q12H  . sodium chloride flush  3 mL Intravenous Q12H    Infusions: . sodium chloride 50 mL/hr at 08/17/2016 0700  . sodium chloride    . amiodarone 30 mg/hr (08/18/2016 0700)  . dextrose    . epinephrine 7 mcg/min (08/08/2016 0808)  . fentaNYL infusion INTRAVENOUS 250 mcg/hr (08/29/2016 0700)  . furosemide    . heparin 600 Units/hr (08/30/2016 0700)  . midazolam (VERSED) infusion 2 mg/hr (08/26/2016 0700)  . milrinone 0.5 mcg/kg/min (08/25/2016 0700)  . norepinephrine (LEVOPHED) Adult infusion Stopped (07/05/2016 2353)  . piperacillin-tazobactam (ZOSYN)  IV 3.375 g (08/15/2016  9373)  .  sodium bicarbonate (isotonic) infusion in sterile water 75 mL/hr at 08/27/2016 0700  . vancomycin      PRN Medications: sodium chloride, acetaminophen, dextrose, fentaNYL (SUBLIMAZE) injection, midazolam, ondansetron (ZOFRAN) IV, pneumococcal 23 valent vaccine, sodium chloride flush    Patient Profile   Mr Speyer is a 57 year with no past medical history admitted with cardiac/VF arrest. Cath with severe (chronic 3v CAD). Impella placed. Underwent hypothermia protocol.    Assessment/Plan   1. Cardiac/VF Arrest - LHC with severe chronic 3 vessel disease -s/p hypothermia protocol and Impella placement -Impella removed 6/29 due to RLE weakness - Sedated on vent. Head CT 6/29. No acute process 2. Acute Systolic Heart Failure -> Cardiogenic Shock  -EF 20% by echo 6/27 -Marked decompensated last night with profound shock and multi-system organ failure. Now with IABP and dual pressor support. BP still low despite epi at 7 and milrinone 0.5. SVR low. Will add vasopressin.  - Prognosis extremely poor 3. Acute Respiratory Failure - Intubated. Remains on 70%. Diurese as tolerated - Repeat CXR later today 4. VT/VF - 6/28 with shock x2. Recurrent VT/VF 6/30 with shock  Continue IV amio. Keep K >4 and Mg > 2 - IF recovers will need ICD vs LifeVest 5. CAD - 3 vessel disease,  severe - Continue ASA 6. RLE weakness - Head CT 6/29 negative - ? Nerve injury vs psoas bleed vs vascular ischemia. \ - Perfusion seems worse today. VVS consulted. D/w Dr. Scot Dock at bedside. Appreciate his help  7. Hyperkalemia - Improved 8. Hyponatremia - Sodium stable at 130 9. Elevated WBC - ?aspiration PNA. Covering with vanc/zosyn 10. Shock Live - AST/ALT elevated on admit. Trending down. Not on statin.  11. Acute blood loss anemia - Stable this am  12. AKI - Creatinine up 1.3 ->1.6 -> 1.9. Will repeat  He is critically ill with multi-system organ failure. Will likely not survive. I have spoken to several family members. Will continue aggressive care for now. If he will lose leg, family all agrees that he would not want to survive if his leg won't survive.    CRITICAL CARE Performed by: Glori Bickers  Total critical care time: 60 minutes  Critical care time was exclusive of separately billable procedures and treating other patients.  Critical care was necessary to treat or prevent imminent or life-threatening deterioration.  Critical care was time spent personally by me (independent of midlevel providers or residents) on the following activities: development of treatment plan with patient and/or surrogate as well as nursing, discussions with consultants, evaluation of patient's response to treatment, examination of patient, obtaining history from patient or surrogate, ordering and performing treatments and interventions, ordering and review of laboratory studies, ordering and review of radiographic studies, pulse oximetry and re-evaluation of patient's condition.    Length of Stay: 4   Glori Bickers, MD  08/23/2016, 8:50 AM  Advanced Heart Failure Team Pager [phone] (M-F; 7a - 4p)  Please contact [facility] for night-coverage after hours (4p -7a ) and weekends on [email or web address]

## 2016-08-02 NOTE — Transfer of Care (Signed)
Immediate Anesthesia Transfer of Care Note  Patient: Jay Livingston  Procedure(s) Performed: Procedure(s): Right Femoral Artery Exploration with Thrombectomy Right Iliac Artery (Right) Right Common Femoral Endarterectomy with Vein Patch Angioplasty. (Right) Four compartment Fasciotomy Right Lower Leg (Right)  Patient Location: Nursing Unit  Anesthesia Type:General  Level of Consciousness: Patient remains intubated per anesthesia plan  Airway & Oxygen Therapy: Patient remains intubated per anesthesia plan  Post-op Assessment: Post -op Vital signs reviewed and stable  Post vital signs: stable  Last Vitals:  Vitals:   08/18/2016 0900 08/29/2016 1000  BP: (!) 95/41 (!) 101/43  Pulse:    Resp:    Temp:      Last Pain:  Vitals:   08/07/2016 0230  TempSrc: Core (Comment)         Complications: No apparent anesthesia complications

## 2016-08-02 NOTE — Progress Notes (Signed)
ANTICOAGULATION CONSULT NOTE   Pharmacy Consult for heparin > resume post-op Indication: IABP placement   No Known Allergies  Patient Measurements: Height: 5\' 10"  (177.8 cm) Weight: 228 lb 2.8 oz (103.5 kg) IBW/kg (Calculated) : 73 Heparin Dosing Weight: 95 kg  Vital Signs: Temp: 98.1 F (36.7 C) (07/01 0715) Temp Source: Core (Comment) (07/01 0230) BP: 101/43 (07/01 1000) Pulse Rate: 97 (07/01 0731)  Labs:  Recent Labs  07/04/2016 0329 07/07/2016 1913 08/21/2016 0242 08/15/2016 0612 09/01/2016 0805 08/29/2016 0827  HGB 11.1*  --  8.9*  --   --  8.1*  HCT 33.5*  --  26.1*  --   --  24.1*  PLT 147*  --  111*  --   --  94*  APTT  --   --   --  51*  --   --   LABPROT  --   --   --  15.7*  --   --   INR  --   --   --  1.24  --   --   HEPARINUNFRC  --   --   --  0.12* 0.11*  --   CREATININE 1.63* 1.78* 1.93*  --   --  1.85*    Estimated Creatinine Clearance: 44.8 mL/min (A) (by C-G formula based on SCr of 1.85 mg/dL (H)).   Medical History: Past Medical History:  Diagnosis Date  . Medical history non-contributory    per family does not see a doctor, is not on any medications    [name] to start heparin after placement of IABP.  Heparin level low this morning on 600 units/hr.  Platelet count dropping, likely due to IABP.  No overt bleeding or complications noted.  Now s/p OR for R femoral thrombectomy.  [name], okay to resume IV heparin now.  Goal of Therapy:  Heparin level: 0.2 -0.5 units/ml Monitor platelets by anticoagulation protocol: Yes   Plan:  1. Resume IV heparin at 750 units/hr. 2. Recheck heparin level in 6 hours. 3. Daily heparin level and CBC.   [name], Pharm D, BCPS  Clinical Pharmacist Pager [phone]  09/01/2016 1:47 PM

## 2016-08-02 NOTE — Progress Notes (Signed)
ANTICOAGULATION CONSULT NOTE   Pharmacy Consult for heparin Indication: IABP placement   No Known Allergies  Patient Measurements: Height: 5\' 10"  (177.8 cm) Weight: 228 lb 2.8 oz (103.5 kg) IBW/kg (Calculated) : 73 Heparin Dosing Weight: 95 kg  Vital Signs: Temp: 98.1 F (36.7 C) (07/01 0715) Temp Source: Core (Comment) (07/01 0230) BP: 93/38 (07/01 0800) Pulse Rate: 97 (07/01 0731)  Labs:  Recent Labs  07/23/2016 0329 07/15/2016 1913 08/04/2016 0242 08/08/2016 0612 08/17/2016 0805 08/30/2016 0827  HGB 11.1*  --  8.9*  --   --  8.1*  HCT 33.5*  --  26.1*  --   --  24.1*  PLT 147*  --  111*  --   --  94*  APTT  --   --   --  51*  --   --   LABPROT  --   --   --  15.7*  --   --   INR  --   --   --  1.24  --   --   HEPARINUNFRC  --   --   --  0.12* 0.11*  --   CREATININE 1.63* 1.78* 1.93*  --   --   --     Estimated Creatinine Clearance: 42.9 mL/min (A) (by C-G formula based on SCr of 1.93 mg/dL (H)).   Medical History: Past Medical History:  Diagnosis Date  . Medical history non-contributory    per family does not see a doctor, is not on any medications    [name] to start heparin after placement of IABP.  Heparin level low this morning on 600 units/hr.  Platelet count dropping, likely due to IABP.  No overt bleeding or complications noted.  Goal of Therapy:  Heparin level: 0.2 -0.5 units/ml Monitor platelets by anticoagulation protocol: Yes   Plan:  1. Increase IV heparin to 750 units/hr 2. Recheck heparin level in 8 hours. 3. Follow up plans for OR today? 4. Daily heparin level and CBC.   Tad MooreJessica Laurin Paulo, Pharm D, BCPS  Clinical Pharmacist Pager [phone]  08/07/2016 9:20 AM

## 2016-08-02 NOTE — Progress Notes (Signed)
Patient was transported from room 2H06 to OR room 11 and back to room 2H06 without complications.

## 2016-08-02 NOTE — Op Note (Signed)
NAME: Jay Livingston    MRN: [id number] DOB: 11-17-1945    DATE OF OPERATION: 08/20/2016  PREOP DIAGNOSIS:    Ischemic right lower extremity  POSTOP DIAGNOSIS:    Same  PROCEDURE:    RIGHT FEMORAL THROMBECTOMY RIGHT COMMON FEMORAL ARTERY ENDARTERECTOMY VEIN PATCH ANGIOPLASTY OF THE RIGHT COMMON FEMORAL ARTERY 4 COMPARTMENT FASCIOTOMY AND PLACEMENT OF NEGATIVE PRESSURE DRESSINGS  SURGEON: Di Kindle. Edilia Bo, MD, FACS  ASSIST: Doreatha Massed, PA  ANESTHESIA: Gen.   EBL: Minimal  INDICATIONS:    Jay Livingston is a 71 y.o. male who developed an ischemic right lower extremity. He had presented after pulseless V. tach and had a ventricular assist device placed to the right groin. He was not moving the right leg well yesterday so the sheath was removed from the right and an intra-aortic balloon pump placed on the left. He was noted today to have an ischemic right lower extremity and is taken urgently for attempted exploration of the right common femoral artery.  FINDINGS:    There was clot in the right external iliac artery above the cannulation site. There was also plaque in the common femoral artery which was endarterectomized. I passed the Fogarty catheter distally and it was no clot retrieved distally and the catheter passed proximally 60 cm. There was a posterior tibial signal and anterior tibial signal at the completion of the procedure. 4 compartment fasciotomy was performed there was moderate swelling of the muscle.  TECHNIQUE:    The patient was taken to the operating room and received a general anesthetic. The abdomen and right groin and right lower extremity were prepped and draped in usual sterile fashion. Longitudinal incision was made in the right groin. The common femoral, deep femoral, superficial femoral arteries were dissected free. The area of cannulation had clot above it and I was able to get above this for proximal control before exploring this. The patient  was then heparinized. A clamp was in place on the external iliac artery. The superficial femoral and deep femoral arteries were controlled with vessel loops. The plug was removed from the cannulation site and there was no flow in the artery. The artery was somewhat fragile from the cannulation site. I then passed a 4 Fogarty catheter proximally and retrieved a moderate amount of clot. At this point established excellent inflow. The cath was passed again until no further clot was retrieved. The artery was then clamped. I passed the catheter distally down the superficial femoral artery and no clot was retrieved. There was significant posterior plaque and this was endarterectomized. A segment of the great saphenous vein was harvested and ligated proximally and distally and opened longitudinally to be used as a vein patch. This was sewn using continuous 5-0 Prolene suture. Prior to completing this anastomosis the artery was backbled and flushed appropriately and the anastomosis completed. Flow was good flow in the superficial femoral artery and deep femoral artery to completion.  At this point, I assessed the calves which appeared somewhat tense. I therefore elected to proceed with 4 compartment fasciotomy. A longitudinal incision was made over the lateral aspect of the right calf and the lateral compartment was decompressed by opening the fascia. There was moderate swelling. I then continued the dissection anteriorly and exposed the anterior compartment. The anterior fascia was opened and again there was moderate swelling. On the medial side a longitudinal incision was made preserving the great saphenous vein. The posterior superficial compartment was decompressed and then was moderate swelling. I  then divided the soleus muscle and the fascia beneath this to expose the deep compartment. There was no significant swelling there. Hemostasis was obtained in the wounds. I then placed a negative pressure dressing on his 2  fasciotomy sites and these were connected to suction.  Hemostasis was obtained in the right groin. This was closed with 2 deep layers of 3-0 Vicryl the skin closed with 4-0 Vicryl. I did place a 15 Blake drain. I then placed a Prevena dressing over the right groin incision. At the completion of the procedure there was a dopplerable posterior tibial and anterior tibial signal.  The patient was transferred back to the intensive care unit in critical condition.  Jay Ferrarihristopher Dickson, MD, FACS [facility] [place]  DATE OF DICTATION:   08/24/2016

## 2016-08-02 NOTE — Consult Note (Signed)
Patient name: Jay Livingston MRN: [id number] DOB: December 19, 1945 Sex: male   REASON FOR CONSULT:    Ischemic Right Lower Extremity. Consult is requested by Dr. Haroldine Laws.   HPI:   Jay Livingston is a 71 y.o. male, Who presented after a cardiac arrest at work on 07/28/2016. He had 10 minutes of down time prior to arrival of EMS. He had pulseless V. tach and underwent ACLS. He was taken urgently to the cath lab. He had severe 3 vessel coronary artery disease and had a ventricular assist device placed in the right groin. He was placed on the hypothermia protocol. Yesterday, he was reportedly having some problems moving the right leg and for this reason the left ventricular assist device was removed from the right groin. This was a 37 French sheath An intra-aortic balloon pump was then placed through the left groin. This morning it was noted that his right lower extremity is profoundly ischemic with no Doppler flow in the right lower extremity. This is mottled up to the groin.  The patient is intubated on a ventilator. I did try to obtain some history from his son. Apparently there was no history of claudication or lower extremity symptoms although the son tells me that the patient would never complained of much regardless.   According to the son, he did not have any significant risk factors for peripheral vascular disease. Specifically, the son was unaware of any history of diabetes, hypertension, hypercholesterolemia, family history of premature cardiovascular disease. He did have a remote history of tobacco use.  Past Medical History:  Diagnosis Date  . Medical history non-contributory    per family does not see a doctor, is not on any medications    History reviewed. No pertinent family history.  SOCIAL HISTORY: Social History   Social History  . Marital status: Single    Spouse name: N/A  . Number of children: N/A  . Years of education: N/A   Occupational History  . Not on file.    Social History Main Topics  . Smoking status: Former Smoker    Types: Cigarettes    Quit date: 07/30/2010  . Smokeless tobacco: Never Used  . Alcohol use 29.4 oz/week    42 Cans of beer, 7 Shots of liquor per week     Comment: per family report  . Drug use: No  . Sexual activity: Not Currently   Other Topics Concern  . Not on file   Social History Narrative  . No narrative on file    No Known Allergies  Current Facility-Administered Medications  Medication Dose Route Frequency Provider Last Rate Last Dose  . 0.9 %  sodium chloride infusion   Intravenous Continuous Rigoberto Noel, MD 50 mL/hr at 08/29/2016 0700    . 0.9 %  sodium chloride infusion  250 mL Intravenous PRN Bensimhon, Shaune Pascal, MD      . acetaminophen (TYLENOL) tablet 650 mg  650 mg Oral Q4H PRN Bensimhon, Shaune Pascal, MD   650 mg at 08/13/2016 0522  . amiodarone (NEXTERONE PREMIX) 360-4.14 MG/200ML-% (1.8 mg/mL) IV infusion  30 mg/hr Intravenous Continuous Clegg, Amy D, NP 16.7 mL/hr at 08/24/2016 0700 30 mg/hr at 08/22/2016 0700  . aspirin chewable tablet 81 mg  81 mg Oral Daily Jettie Booze, MD   81 mg at 07/23/2016 1002  . chlorhexidine gluconate (MEDLINE KIT) (PERIDEX) 0.12 % solution 15 mL  15 mL Mouth Rinse BID Rigoberto Noel, MD   15 mL at  08/30/2016 0800  . Chlorhexidine Gluconate Cloth 2 % PADS 6 each  6 each Topical Daily Jettie Booze, MD   6 each at 07/15/2016 1700  . dextrose 10 % infusion   Intravenous Continuous PRN Mannam, Praveen, MD      . EPINEPHrine (ADRENALIN) 4 mg in dextrose 5 % 250 mL (0.016 mg/mL) infusion  2 mcg/min Intravenous Titrated Bensimhon, Shaune Pascal, MD 26.3 mL/hr at 08/18/2016 0808 7 mcg/min at 08/29/2016 0808  . feeding supplement (PRO-STAT SUGAR FREE 64) liquid 30 mL  30 mL Per Tube 5 X Daily Mannam, Praveen, MD   30 mL at 07/11/2016 1802  . feeding supplement (VITAL HIGH PROTEIN) liquid 1,000 mL  1,000 mL Per Tube Q24H Mannam, Praveen, MD      . fentaNYL (SUBLIMAZE) injection 25-50 mcg   25-50 mcg Intravenous Q2H PRN Desai, Rahul P, PA-C   50 mcg at 07/31/16 1239  . fentaNYL (SUBLIMAZE) injection 50 mcg  50 mcg Intravenous Once Rigoberto Noel, MD      . fentaNYL 2542mg in NS 2514m(1051mml) infusion-PREMIX  10-300 mcg/hr Intravenous Continuous Mannam, Praveen, MD 25 mL/hr at 09/01/2016 0700 250 mcg/hr at 08/11/2016 0700  . furosemide (LASIX) 120 mg in dextrose 5 % 50 mL IVPB  120 mg Intravenous Q8H Bensimhon, Daniel R, MD      . heparin ADULT infusion 100 units/mL (25000 units/250m38mdium chloride 0.45%)  600 Units/hr Intravenous Continuous VaraJettie Booze 6 mL/hr at 08/21/2016 0700 600 Units/hr at 08/26/2016 0700  . insulin aspart (novoLOG) injection 0-20 Units  0-20 Units Subcutaneous Q4H Bensimhon, DaniShaune Pascal   4 Units at 08/07/2016 [id number] aspart (novoLOG) injection 1 Units  1 Units Subcutaneous Q4H Mannam, Praveen, MD   1 Units at 07/25/2016 2049  . insulin glargine (LANTUS) injection 10 Units  10 Units Subcutaneous Daily Mannam, Praveen, MD   10 Units at 07/05/2016 1050  . MEDLINE mouth rinse  15 mL Mouth Rinse 10 times per day AlvaRigoberto Noel   15 mL at 08/31/2016 0524  . midazolam (VERSED) 50 mg in sodium chloride 0.9 % 50 mL (1 mg/mL) infusion  0-10 mg/hr Intravenous Continuous Bensimhon, DaniShaune Pascal 2 mL/hr at 08/03/2016 0700 2 mg/hr at 09/01/2016 0700  . midazolam (VERSED) injection 1-2 mg  1-2 mg Intravenous Q2H PRN DesaShearon Stallshul P, PA-C   2 mg at 08/27/2016 0157  . milrinone (PRIMACOR) 20 MG/100 ML (0.2 mg/mL) infusion  0.5 mcg/kg/min Intravenous Continuous Bensimhon, DaniShaune Pascal 14.7 mL/hr at 08/26/2016 0700 0.5 mcg/kg/min at 08/03/2016 0700  . mupirocin ointment (BACTROBAN) 2 % 1 application  1 application Nasal BID AlvaRigoberto Noel   1 application at 06/308/67/613  . norepinephrine (LEVOPHED) 16 mg in dextrose 5 % 250 mL (0.064 mg/mL) infusion  0-50 mcg/min Intravenous Titrated VaraJettie Booze   Stopped at 07/25/2016 2353  . ondansetron (ZOFRAN) injection 4 mg   4 mg Intravenous Q6H PRN Bensimhon, DaniShaune Pascal      . pantoprazole sodium (PROTONIX) 40 mg/20 mL oral suspension 40 mg  40 mg Per Tube Daily WilsLyndee LeoH   40 mg at 07/03/2016 1002  . piperacillin-tazobactam (ZOSYN) IVPB 3.375 g  3.375 g Intravenous Q8H WilsLyndee LeoH 12.5 mL/hr at 08/22/2016 0526 3.375 g at 08/18/2016 0526  . pneumococcal 23 valent vaccine (PNU-IMMUNE) injection 0.5 mL  0.5 mL Intramuscular Prior to discharge StraVickey Sages      .  sodium bicarbonate 150 mEq in sterile water 1,000 mL infusion   Intravenous Continuous Bensimhon, Daniel R, MD 75 mL/hr at 08/26/2016 0700    . sodium chloride flush (NS) 0.9 % injection 10-40 mL  10-40 mL Intracatheter Q12H Varanasi, Jayadeep S, MD   10 mL at 07/25/2016 2231  . sodium chloride flush (NS) 0.9 % injection 3 mL  3 mL Intravenous Q12H Bensimhon, Daniel R, MD   3 mL at 08/29/2016 0434  . sodium chloride flush (NS) 0.9 % injection 3 mL  3 mL Intravenous PRN Bensimhon, Daniel R, MD      . vancomycin (VANCOCIN) 1,250 mg in sodium chloride 0.9 % 250 mL IVPB  1,250 mg Intravenous Q24H Wilson, Frank R, RPH        REVIEW OF SYSTEMS: Could not be obtained.   PHYSICAL EXAM:   Vitals:   08/13/2016 0645 08/11/2016 0700 08/08/2016 0715 08/23/2016 0800  BP:    (!) 93/38  Pulse:      Resp: 19 18 18   Temp: 98.2 F (36.8 C) 98.1 F (36.7 C) 98.1 F (36.7 C)   TempSrc:      SpO2: 100% 100% 100%   Weight:      Height:        GENERAL: The patient is on a ventilator and is responsive to voice. The vital signs are documented above. CARDIAC: There is a regular rate and rhythm.  VASCULAR:  I do not detect carotid bruits. On the right side, which is the side of concern, I cannot palpate a right femoral pulse. He has no Doppler flow on the right foot. The entire right lower extremity is mottled up to the groin and I do not see any movement. On the left side I cannot assess the femoral pulse as he has an intra-aortic balloon pump on the left.  He does have a posterior tibial signal with the Doppler on the left. PULMONARY: There is good air exchange bilaterally without wheezing or rales. ABDOMEN:  OB's and difficult to assess. In addition he has a cooling device overlying his abdomen and lower chest. MUSCULOSKELETAL: There are no major deformities. NEUROLOGIC: Unable to assess. SKIN: both lower extremities are mottled. The right lower extremity is profoundly mottled up to the groin. PSYCHIATRIC: unable to assess.  DATA:    LABS: Hemoglobin is 8.1. Hematocrit is 24.1. Platelet count 94,000. INR is 1.24. PTT 51. His creatinine is 1.93. Potassium is 4.5. GFR is 34.  MEDICAL ISSUES:   RIGHT LOWER EXTREMITY ISCHEMIA: This patient has a profoundly ischemic right lower extremity. In addition, he had some problems moving the right leg yesterday. He had a large device placed through the right groin on admission and this was removed yesterday. This was a left ventricular assist device which required a 14 French sheath. Certainly it is possible that he has some retroperitoneal hematoma which could compress the nerve and explain the weakness in the right leg yesterday. We could consider a CT of the abdomen although I would not want to use contrast given his renal insufficiency. However, given that this would not change his treatment and would also be associated with significant risk given transport to the CT scanner I would not recommend this. Even if he had a retrograde hematoma this would not be explored.   Certainly would be helpful to have an arteriogram to evaluate the right lower extremity however again I would be reluctant to use contrast given his renal insufficiency. I have recommended that we   proceed with a duplex of the right groin. If the right femoral artery is patent then I do not think that it would be useful to explore the right groin in the operating room. If he has infrainguinal arterial occlusive disease I really do not think that  he would tolerate any major intervention or attempted revascularization. I think the only potential correctable problem would be a local issue in the right femoral artery. If the duplex scan shows evidence of a problem here we could consider explanation of the right groin. We could potentially consider intraoperative arteriography with limited contrast if there is an inflow problem.   However, given that he has an intra-aortic balloon pump, is on pressors, and remains hypotensive, any trip to the operating room would be associated with significant risk.  I have discussed this with his son. I will make further recommendations pending results of his duplex scan. I've also discussed the case with Dr. Haroldine Laws.  Deitra Mayo [facility] [place] [phone]

## 2016-08-02 DEATH — deceased

## 2016-08-03 ENCOUNTER — Encounter (HOSPITAL_COMMUNITY): Payer: Self-pay | Admitting: Internal Medicine

## 2016-08-03 DIAGNOSIS — J9601 Acute respiratory failure with hypoxia: Secondary | ICD-10-CM

## 2016-08-03 DIAGNOSIS — I2510 Atherosclerotic heart disease of native coronary artery without angina pectoris: Secondary | ICD-10-CM

## 2016-08-03 DIAGNOSIS — G9340 Encephalopathy, unspecified: Secondary | ICD-10-CM

## 2016-08-03 DIAGNOSIS — I998 Other disorder of circulatory system: Secondary | ICD-10-CM

## 2016-08-03 LAB — COMPREHENSIVE METABOLIC PANEL
ALBUMIN: 1.6 g/dL — AB (ref 3.5–5.0)
ALT: 119 U/L — AB (ref 17–63)
AST: 379 U/L — AB (ref 15–41)
Alkaline Phosphatase: 60 U/L (ref 38–126)
Anion gap: 10 (ref 5–15)
BILIRUBIN TOTAL: 1.3 mg/dL — AB (ref 0.3–1.2)
BUN: 38 mg/dL — AB (ref 6–20)
CO2: 24 mmol/L (ref 22–32)
CREATININE: 2.39 mg/dL — AB (ref 0.61–1.24)
Calcium: 5.9 mg/dL — CL (ref 8.9–10.3)
Chloride: 95 mmol/L — ABNORMAL LOW (ref 101–111)
GFR calc Af Amer: 30 mL/min — ABNORMAL LOW (ref >60)
GFR calc non Af Amer: 26 mL/min — ABNORMAL LOW (ref >60)
Glucose, Bld: 140 mg/dL — ABNORMAL HIGH (ref 65–99)
POTASSIUM: 5.3 mmol/L — AB (ref 3.5–5.1)
Sodium: 129 mmol/L — ABNORMAL LOW (ref 135–145)
TOTAL PROTEIN: 4.4 g/dL — AB (ref 6.5–8.1)

## 2016-08-03 LAB — CBC
HEMATOCRIT: 29.8 % — AB (ref 39.0–52.0)
HEMATOCRIT: 29.4 % — AB (ref 39.0–52.0)
HEMOGLOBIN: 9.8 g/dL — AB (ref 13.0–17.0)
Hemoglobin: 10.1 g/dL — ABNORMAL LOW (ref 13.0–17.0)
MCH: 29.4 pg (ref 26.0–34.0)
MCH: 29.1 pg (ref 26.0–34.0)
MCHC: 33.9 g/dL (ref 30.0–36.0)
MCHC: 33.3 g/dL (ref 30.0–36.0)
MCV: 86.9 fL (ref 78.0–100.0)
MCV: 87.2 fL (ref 78.0–100.0)
PLATELETS: 79 10*3/uL — AB (ref 150–400)
Platelets: 91 10*3/uL — ABNORMAL LOW (ref 150–400)
RBC: 3.43 MIL/uL — ABNORMAL LOW (ref 4.22–5.81)
RBC: 3.37 MIL/uL — ABNORMAL LOW (ref 4.22–5.81)
RDW: 16.1 % — AB (ref 11.5–15.5)
RDW: 16.1 % — ABNORMAL HIGH (ref 11.5–15.5)
WBC: 10.5 10*3/uL (ref 4.0–10.5)
WBC: 12.3 10*3/uL — AB (ref 4.0–10.5)

## 2016-08-03 LAB — BASIC METABOLIC PANEL
ANION GAP: 12 (ref 5–15)
BUN: 43 mg/dL — ABNORMAL HIGH (ref 6–20)
CHLORIDE: 94 mmol/L — AB (ref 101–111)
CO2: 23 mmol/L (ref 22–32)
Calcium: 6.5 mg/dL — ABNORMAL LOW (ref 8.9–10.3)
Creatinine, Ser: 2.87 mg/dL — ABNORMAL HIGH (ref 0.61–1.24)
GFR calc Af Amer: 24 mL/min — ABNORMAL LOW (ref >60)
GFR, EST NON AFRICAN AMERICAN: 21 mL/min — AB (ref >60)
GLUCOSE: 64 mg/dL — AB (ref 65–99)
POTASSIUM: 5.2 mmol/L — AB (ref 3.5–5.1)
Sodium: 129 mmol/L — ABNORMAL LOW (ref 135–145)

## 2016-08-03 LAB — POCT I-STAT 3, ART BLOOD GAS (G3+)
Acid-Base Excess: 3 mmol/L — ABNORMAL HIGH (ref 0.0–2.0)
Bicarbonate: 27.8 mmol/L (ref 20.0–28.0)
O2 SAT: 92 %
PCO2 ART: 43 mmHg (ref 32.0–48.0)
PO2 ART: 63 mmHg — AB (ref 83.0–108.0)
Patient temperature: 36.8
TCO2: 29 mmol/L (ref 0–100)
pH, Arterial: 7.419 (ref 7.350–7.450)

## 2016-08-03 LAB — GLUCOSE, CAPILLARY
GLUCOSE-CAPILLARY: 183 mg/dL — AB (ref 65–99)
GLUCOSE-CAPILLARY: 256 mg/dL — AB (ref 65–99)
GLUCOSE-CAPILLARY: 128 mg/dL — AB (ref 65–99)
GLUCOSE-CAPILLARY: 128 mg/dL — AB (ref 65–99)
Glucose-Capillary: 113 mg/dL — ABNORMAL HIGH (ref 65–99)
Glucose-Capillary: 147 mg/dL — ABNORMAL HIGH (ref 65–99)
Glucose-Capillary: 74 mg/dL (ref 65–99)
Glucose-Capillary: 65 mg/dL (ref 65–99)
Glucose-Capillary: 100 mg/dL — ABNORMAL HIGH (ref 65–99)

## 2016-08-03 LAB — COOXEMETRY PANEL
CARBOXYHEMOGLOBIN: 0.9 % (ref 0.5–1.5)
Methemoglobin: 1.3 % (ref 0.0–1.5)
O2 Saturation: 69 %
Total hemoglobin: 10.1 g/dL — ABNORMAL LOW (ref 12.0–16.0)

## 2016-08-03 LAB — HEPARIN LEVEL (UNFRACTIONATED)
HEPARIN UNFRACTIONATED: 0.14 [IU]/mL — AB (ref 0.30–0.70)
Heparin Unfractionated: 0.15 IU/mL — ABNORMAL LOW (ref 0.30–0.70)
Heparin Unfractionated: 0.18 IU/mL — ABNORMAL LOW (ref 0.30–0.70)

## 2016-08-03 LAB — CALCIUM, IONIZED: CALCIUM, IONIZED, SERUM: 3.8 mg/dL — AB (ref 4.5–5.6)

## 2016-08-03 LAB — LACTIC ACID, PLASMA
LACTIC ACID, VENOUS: 1.5 mmol/L (ref 0.5–1.9)
Lactic Acid, Venous: 1.6 mmol/L (ref 0.5–1.9)

## 2016-08-03 LAB — PROCALCITONIN: Procalcitonin: 8.25 ng/mL

## 2016-08-03 LAB — MAGNESIUM: Magnesium: 2.2 mg/dL (ref 1.7–2.4)

## 2016-08-03 MED ORDER — DEXTROSE 50 % IV SOLN
25.0000 mL | Freq: Once | INTRAVENOUS | Status: AC
  Administered 2016-08-03: 25 mL via INTRAVENOUS
  Filled 2016-08-03: qty 50

## 2016-08-03 MED ORDER — CALCIUM GLUCONATE 10 % IV SOLN
2.0000 g | Freq: Once | INTRAVENOUS | Status: AC
  Administered 2016-08-03: 2 g via INTRAVENOUS
  Filled 2016-08-03: qty 20

## 2016-08-03 MED ORDER — HEPARIN (PORCINE) IN NACL 100-0.45 UNIT/ML-% IJ SOLN
1250.0000 [IU]/h | INTRAMUSCULAR | Status: DC
  Administered 2016-08-04 (×2): 1250 [IU]/h via INTRAVENOUS
  Filled 2016-08-03 (×2): qty 250

## 2016-08-03 MED ORDER — SODIUM CHLORIDE 0.9 % IV SOLN: 1.0000 g | Freq: Once | INTRAVENOUS | Status: DC

## 2016-08-03 MED ORDER — DEXTROSE 5 % IV SOLN
6.0000 mg/h | INTRAVENOUS | Status: DC
  Administered 2016-08-03 (×2): 6 mg/h via INTRAVENOUS
  Filled 2016-08-03 (×2): qty 25

## 2016-08-03 NOTE — Progress Notes (Signed)
ANTICOAGULATION CONSULT NOTE - Follow Up Consult  Pharmacy Consult for Heparin Indication: IABP  No Known Allergies  Patient Measurements: Height: 5\' 10"  (177.8 cm) Weight: 228 lb 2.8 oz (103.5 kg) IBW/kg (Calculated) : 73 Heparin Dosing Weight: 95kg  Vital Signs: Temp: 97.9 F (36.6 C) (07/02 2300) Temp Source: Core (Comment) (07/02 2300) BP: 99/61 (07/02 2300) Pulse Rate: 110 (07/02 2300)  Labs:  Recent Labs  08/31/2016 0612  08/15/2016 1907 08/03/16 0418 08/03/16 1330 08/03/16 1747 08/03/16 2230  HGB  --   < > 10.6* 10.1*  --  9.8*  --   HCT  --   < > 31.9* 29.8*  --  29.4*  --   PLT  --   < > 86* 79*  --  91*  --   APTT 51*  --   --   --   --   --   --   LABPROT 15.7*  --   --   --   --   --   --   INR 1.24  --   --   --   --   --   --   HEPARINUNFRC 0.12*  < >  --  0.14* 0.15*  --  0.18*  CREATININE  --   < > 1.94* 2.39*  --  2.87*  --   < > = values in this interval not displayed.  Estimated Creatinine Clearance: 28.9 mL/min (A) (by C-G formula based on SCr of 2.87 mg/dL (H)).   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 Also now has ischemic left leg - no intervention yet per [facility]. Heparin level remains low at 0.18.   Goal of Therapy:  Heparin level 0.2-0.5 units/ml Monitor platelets by anticoagulation protocol: Yes   Plan:  1) Increase heparin to 1250 units/hr 2) Check heparin level in 8 hours  Pollyann SamplesAndy Jaleiah Asay, PharmD, BCPS 08/03/2016, 11:05 PM

## 2016-08-03 NOTE — Progress Notes (Signed)
Advanced Heart Failure Rounding Note  PCP:  Primary Cardiologist:   Subjective:    Impella pulled 6/29 due to inability to move R leg.  Last night developed recurrent VT/VF receiving several shocks. Developed progressive shock, acidosis and hypoxemia despite increasing pressors.   He was taken emergently to cath lab 6/30 for left femoral IABP and swan as a salvage attempt.   On 7/1 taken to OR for ischemic RLE. R femoral artery occluded with clot and plaque. Underwent emergent  thrombectomy and endarectomy with 4-compartment fasciotomy.   Coox 69% this am. Remains on Epi 4, vasopressin and milrinone at 0.5 mcg/kg/min. Remains on amio gtt. MAP in low 60s.  Remains on ventilator. Oxygenation OK. Intermittently awake.   ABG  4.42/43/63/92%   Lactate 4.5 at 2300 last night. Recheck pending. -> 1.6    CVP 8 PA 56/26 PCW 21 CO/CI 7.0/3.2    Objective:   Weight Range: 228 lb 2.8 oz (103.5 kg) Body mass index is 32.74 kg/m.   Vital Signs:   Temp:  [97.3 F (36.3 C)-98.6 F (37 C)] 98.2 F (36.8 C) (07/02 0630) Pulse Rate:  [85-282] 259 (07/02 0630) Resp:  [8-20] 18 (07/02 0630) BP: (65-122)/(38-62) 93/52 (07/02 0600) SpO2:  [91 %-100 %] 99 % (07/02 0630) Arterial Line BP: (71-122)/(41-60) 90/49 (07/02 0630) FiO2 (%):  [60 %-80 %] 80 % (07/02 0500) Last BM Date:  (PTA)  Weight change: Filed Weights   07/30/16 0500 07/31/16 0500 07/14/2016 0400  Weight: 201 lb 8 oz (91.4 kg) 216 lb 0.8 oz (98 kg) 228 lb 2.8 oz (103.5 kg)    Intake/Output:   Intake/Output Summary (Last 24 hours) at 08/03/16 0735 Last data filed at 08/03/16 0700  Gross per 24 hour  Intake          4413.99 ml  Output             5505 ml  Net         -1091.01 ml      Physical Exam   General: Intubated. Critically ill appearing.  HEENT: +ETT Neck: Supple. RIJ TLC. Carotids 2+ bilat; no bruits. No thyromegaly or nodule noted. Cor: PMI nondisplaced. RRR, No M/G/R noted Lungs:  Rhonchorous throughout.  Abdomen: Soft, non-tender, ++ distended. Hypoactive BS.  Extremities: Markedly edematous. RLE now warm with dopplerable pulse. R groin wound packed with JP drain. RLE asciotomy wounds with vac.  LLE cool and mottled. No dopplerable pulses. Left groin with IABP and swan. Improving ecchymosis.  Neuro: Sedated, awakens at times.  Telemetry   Personally reviewed, NSR with PVCs.   EKG    Not ordered today.   Labs    CBC  Recent Labs  08/11/2016 1907 08/03/16 0418  WBC 10.0 10.5  HGB 10.6* 10.1*  HCT 31.9* 29.8*  MCV 87.2 86.9  PLT 86* 79*   Basic Metabolic Panel  Recent Labs  07/27/2016 1913 08/23/2016 0242  08/26/2016 1907 08/03/16 0418  NA 125* 130*  < > 128* 129*  K 5.7* 4.5  < > 4.6 5.3*  CL 97* 95*  < > 94* 95*  CO2 21* 24  < > 24 24  GLUCOSE 350* 200*  < > 115* 140*  BUN 29* 32*  < > 34* 38*  CREATININE 1.78* 1.93*  < > 1.94* 2.39*  CALCIUM 6.4* 6.7*  < > 6.1* 5.9*  MG 1.8 2.7*  --   --  2.2  PHOS 5.4*  --   --   --   --   < > =  values in this interval not displayed. Liver Function Tests  Recent Labs  08/29/2016 0242 08/03/16 0418  AST 84* 379*  ALT 51 119*  ALKPHOS 52 60  BILITOT 1.2 1.3*  PROT 4.6* 4.4*  ALBUMIN 1.9* 1.6*   No results for input(s): LIPASE, AMYLASE in the last 72 hours. Cardiac Enzymes No results for input(s): CKTOTAL, CKMB, CKMBINDEX, TROPONINI in the last 72 hours.  BNP: BNP (last 3 results) No results for input(s): BNP in the last 8760 hours.  ProBNP (last 3 results) No results for input(s): PROBNP in the last 8760 hours.   D-Dimer No results for input(s): DDIMER in the last 72 hours. Hemoglobin A1C No results for input(s): HGBA1C in the last 72 hours. Fasting Lipid Panel No results for input(s): CHOL, HDL, LDLCALC, TRIG, CHOLHDL, LDLDIRECT in the last 72 hours. Thyroid Function Tests No results for input(s): TSH, T4TOTAL, T3FREE, THYROIDAB in the last 72 hours.  Invalid input(s): FREET3  Other  results:   Imaging    No results found.   Medications:     Scheduled Medications: . aspirin  81 mg Oral Daily  . chlorhexidine gluconate (MEDLINE KIT)  15 mL Mouth Rinse BID  . Chlorhexidine Gluconate Cloth  6 each Topical Daily  . feeding supplement (PRO-STAT SUGAR FREE 64)  30 mL Per Tube 5 X Daily  . feeding supplement (VITAL HIGH PROTEIN)  1,000 mL Per Tube Q24H  . fentaNYL (SUBLIMAZE) injection  50 mcg Intravenous Once  . furosemide  80 mg Intravenous Q8H  . insulin aspart  0-20 Units Subcutaneous Q4H  . insulin aspart  1 Units Subcutaneous Q4H  . insulin glargine  10 Units Subcutaneous Daily  . mouth rinse  15 mL Mouth Rinse 10 times per day  . mupirocin ointment  1 application Nasal BID  . pantoprazole sodium  40 mg Per Tube Daily  . sodium chloride flush  10-40 mL Intracatheter Q12H  . sodium chloride flush  3 mL Intravenous Q12H    Infusions: . sodium chloride 250 mL (08/03/16 0700)  . sodium chloride 10 mL/hr at 08/03/16 0700  . amiodarone 30 mg/hr (08/03/16 0700)  . dextrose    . epinephrine 4 mcg/min (08/03/16 0724)  . fentaNYL infusion INTRAVENOUS 300 mcg/hr (08/03/16 0700)  . heparin 8.5 Units/hr (08/03/16 0700)  . midazolam (VERSED) infusion 4 mg/hr (08/03/16 0700)  . milrinone 0.5 mcg/kg/min (08/03/16 0700)  . norepinephrine (LEVOPHED) Adult infusion Stopped (07/22/2016 2353)  . piperacillin-tazobactam (ZOSYN)  IV 3.375 g (08/03/16 0518)  . vancomycin Stopped (08/09/2016 1102)  . vasopressin (PITRESSIN) infusion - *FOR SHOCK* Stopped (08/23/2016 1700)    PRN Medications: sodium chloride, acetaminophen, dextrose, fentaNYL (SUBLIMAZE) injection, midazolam, ondansetron (ZOFRAN) IV, pneumococcal 23 valent vaccine, sodium chloride flush    Patient Profile   Mr Canion is a 22 year with no past medical history admitted with cardiac/VF arrest. Cath with severe (chronic 3v CAD). Impella placed. Underwent hypothermia protocol.    Assessment/Plan   1.  Cardiac/VF Arrest - LHC with severe chronic 3 vessel disease -s/p hypothermia protocol and Impella placement -Impella removed 6/29 due to RLE weakness/ischemia - Sedated on vent. Head CT 6/29. No acute process.  2. Acute Systolic Heart Failure -> Cardiogenic Shock  -EF 20% by echo 6/27 - Marked decompensation 07/26/2016 with profound shock and MSOF.  - CVP ~5 this am. Will hold lasix for now with worsening creatinine.  - Now with IABP and dual pressor support. BP remains low despite addition of vasopressin.  -  Prognosis extremely poor.  3. Acute Respiratory Failure - Intubated. Remains on 70%. Diurese as tolerated - CXR 08/18/2016 with mild decrease in pulmonary edema and aortic balloon pump within descending thoracic aorta just below level of aortic knob.  4. VT/VF - 6/28 with shock x2. Recurrent VT/VF 6/30 with shock  Continue IV amio. Keep K >4 and Mg > 2 - Would need ICD vs Lifevest if recovers. Prognosis extremely poor.  5. CAD - 3 vessel disease, severe - Continue ASA 6. PAD/Limb ischemia - Head CT 6/29 negative - ? Nerve injury vs psoas bleed vs vascular ischemia.  - s/p RLE emergent thrombectomy, endarterectomy and fasciotomy on 7/1 - LLE appears ischemic. IABP still in place 7. Hyperkalemia - Back up to 5.3. 8. Hyponatremia - Relatively stable at 129 this am.  9. Elevated WBC - ?aspiration PNA. Covering with vanc/zosyn. No change. Afebrile.  10. Shock Liver - AST/ALT elevated on admit. Trended down, but now worse again.  11. Acute blood loss anemia - Relatively stable. Hgb 10.1 this am.  12. AKI - Due to shock/ATN - Creatinine up 1.3 ->1.6 -> 1.9 -> 2.39. Continue to follow.  13. Enerococcal UTI - On vanc and zosyn. Will continue - BCX remain negative  Patient is critically ill and currently in multi-system organ failure.  Prognosis bleak at this point. Will discuss further with family and MD today.   Length of Stay: 5   Annamaria Helling  08/03/2016, 7:35  AM  Advanced Heart Failure Team Pager [phone] (M-F; 7a - 4p)  Please contact [facility] for night-coverage after hours (4p -7a ) and weekends on [email or web address]  Agree with above.  He remains profoundly ill with multi-system organ failure.   Exam Intubate. Awake at times Cor RRR + PVCS Lungs rhonchi Ab markedly distended hypoactive BS Ext RLE warm wound vacs in place. LLE with IABP and swan. Cool slightly mottled  Hemodynamics marginal despite multiple pressors and IABP support. Lactic acidosis has resolved. Has evidence of shock liver and kidney. CVP down but weight up 30 pounds. Continue hemodynamic support. Will place on lasix drip to attempt slow but stead diuresis. Will leave IABP in for now but need to watch LLE for progressive ischemia.   Wean vent as tolerated. On abx for enterococcal UTI.   Prognosis extremely guarded.   CRITICAL CARE Performed by: Glori Bickers  Total critical care time: 45 minutes  Critical care time was exclusive of separately billable procedures and treating other patients.  Critical care was necessary to treat or prevent imminent or life-threatening deterioration.  Critical care was time spent personally by me (independent of midlevel providers or residents) on the following activities: development of treatment plan with patient and/or surrogate as well as nursing, discussions with consultants, evaluation of patient's response to treatment, examination of patient, obtaining history from patient or surrogate, ordering and performing treatments and interventions, ordering and review of laboratory studies, ordering and review of radiographic studies, pulse oximetry and re-evaluation of patient's condition.  Glori Bickers, MD  10:24 AM

## 2016-08-03 NOTE — Progress Notes (Signed)
ANTICOAGULATION CONSULT NOTE   Pharmacy Consult for heparin > resume post-op Indication: IABP placement   No Known Allergies  Patient Measurements: Height: 5\' 10"  (177.8 cm) Weight: 228 lb 2.8 oz (103.5 kg) IBW/kg (Calculated) : 73 Heparin Dosing Weight: 95 kg  Vital Signs: Temp: 97.7 F (36.5 C) (07/02 0445) BP: 75/49 (07/02 0300) Pulse Rate: 262 (07/02 0445)  Labs:  Recent Labs  08/26/2016 0242 09/01/2016 0612 09/01/2016 0805 08/29/2016 0827  08/04/2016 1255 08/22/2016 1907 08/03/16 0418  HGB 8.9*  --   --  8.1*  < > 10.2* 10.6* 10.1*  HCT 26.1*  --   --  24.1*  < > 30.0* 31.9* 29.8*  PLT 111*  --   --  94*  --   --  86* 79*  APTT  --  51*  --   --   --   --   --   --   LABPROT  --  15.7*  --   --   --   --   --   --   INR  --  1.24  --   --   --   --   --   --   HEPARINUNFRC  --  0.12* 0.11*  --   --   --   --  0.14*  CREATININE 1.93*  --   --  1.85*  --   --  1.94*  --   < > = values in this interval not displayed.  Estimated Creatinine Clearance: 42.7 mL/min (A) (by C-G formula based on SCr of 1.94 mg/dL (H)).   Medical History: Past Medical History:  Diagnosis Date  . Medical history non-contributory    per family does not see a doctor, is not on any medications    [name] to start heparin after placement of IABP and R femoral thrombectomy.   Heparin level is subtherapeutic this am at 0.14 after heparin resumed post procedure on 7/1 pm.  PLTc remains low but stable with no reported bleeding.   Goal of Therapy:  Heparin level: 0.2 -0.5 units/ml Monitor platelets by anticoagulation protocol: Yes   Plan:  1. Increase IV heparin to 850 units/hr. 2. Recheck heparin level in 8 hours. 3. Daily heparin level and CBC.   Pollyann SamplesAndy Rafi Kenneth, PharmD, BCPS 08/03/2016, 5:03 AM

## 2016-08-03 NOTE — Progress Notes (Signed)
   VASCULAR SURGERY ASSESSMENT & PLAN:   POD 1: RIGHT FEMORAL THROMBECTOMY RIGHT COMMON FEMORAL ARTERY ENDARTERECTOMY VEIN PATCH ANGIOPLASTY OF THE RIGHT COMMON FEMORAL ARTERY 4 COMPARTMENT FASCIOTOMY AND PLACEMENT OF NEGATIVE PRESSURE DRESSINGS   The right foot has good Doppler flow. The VAC's have a good seal.   There is no Doppler flow in the left foot. The left foot looks ischemic. The only option on the left would be to remove the intra-aortic balloon pump, however he is dependent upon this currently. If this can be removed and we can reassess the foot at that time. However currently does nothing different to do about the left leg. He is on heparin.   VAC change on Monday Wednesday Friday.   SUBJECTIVE:   Sedated on vent.  PHYSICAL EXAM:   Vitals:   08/03/16 0545 08/03/16 0600 08/03/16 0615 08/03/16 0630  BP:  (!) 93/52    Pulse: (!) 251 (!) 257 (!) 209 (!) 259  Resp: _0 Temp: 98.2 F (36.8 C) 98.2 F (36.8 C) 98.4 F (36.9 C) 98.2 F (36.8 C)  TempSrc:      SpO2: 98% 99% 99% 99%  Weight:      Height:       Good dorsalis pedis, posterior tibial, and anterior tibial signal in the right foot. No Doppler flow in the left foot. The left foot looks mottled.  LABS:   Lab Results  Component Value Date   WBC 10.5 08/03/2016   HGB 10.1 (L) 08/03/2016   HCT 29.8 (L) 08/03/2016   MCV 86.9 08/03/2016   PLT 79 (L) 08/03/2016   Lab Results  Component Value Date   CREATININE 2.39 (H) 08/03/2016   Lab Results  Component Value Date   INR 1.24 08/26/2016   CBG (last 3)   Recent Labs  09/01/2016 1604 08/10/2016 2059 08/15/2016 2328  GLUCAP 173* 114* 113*    PROBLEM LIST:    Active Problems:   Cardiac arrest (HCC)   Ventricular tachycardia (HCC)   Coronary artery disease involving native coronary artery   Pressure injury of skin   Cardiogenic shock (HCC)   Acute respiratory failure with hypoxia (HCC)   CURRENT MEDS:   . aspirin  81 mg Oral  Daily  . chlorhexidine gluconate (MEDLINE KIT)  15 mL Mouth Rinse BID  . Chlorhexidine Gluconate Cloth  6 each Topical Daily  . feeding supplement (PRO-STAT SUGAR FREE 64)  30 mL Per Tube 5 X Daily  . feeding supplement (VITAL HIGH PROTEIN)  1,000 mL Per Tube Q24H  . fentaNYL (SUBLIMAZE) injection  50 mcg Intravenous Once  . furosemide  80 mg Intravenous Q8H  . insulin aspart  0-20 Units Subcutaneous Q4H  . insulin aspart  1 Units Subcutaneous Q4H  . insulin glargine  10 Units Subcutaneous Daily  . mouth rinse  15 mL Mouth Rinse 10 times per day  . mupirocin ointment  1 application Nasal BID  . pantoprazole sodium  40 mg Per Tube Daily  . sodium chloride flush  10-40 mL Intracatheter Q12H  . sodium chloride flush  3 mL Intravenous Q12H    Gae Gallop Beeper: [phone] Office: [phone] 08/03/2016

## 2016-08-03 NOTE — Progress Notes (Signed)
Hypoglycemic Event  CBG: 65  Treatment: D50 IV 25 mL  Symptoms: None  Follow-up CBG: Time: 2055 CBG Result: 100  Possible Reasons for Event: Inadequate meal intake, patient currently NPO  Comments/MD notified: N/A, will continue to monitor   Jay Livingston

## 2016-08-03 NOTE — Progress Notes (Signed)
ANTICOAGULATION CONSULT NOTE - Follow Up Consult  Pharmacy Consult for Heparin Indication: IABP  No Known Allergies  Patient Measurements: Height: 5\' 10"  (177.8 cm) Weight: 228 lb 2.8 oz (103.5 kg) IBW/kg (Calculated) : 73 Heparin Dosing Weight: 95kg  Vital Signs: Temp: 98.1 F (36.7 C) (07/02 1400) BP: 107/66 (07/02 1400) Pulse Rate: 202 (07/02 1400)  Labs:  Recent Labs  08/12/2016 0612 08/06/2016 0805 08/10/2016 0827  08/09/2016 1255 08/17/2016 1907 08/03/16 0418 08/03/16 1330  HGB  --   --  8.1*  < > 10.2* 10.6* 10.1*  --   HCT  --   --  24.1*  < > 30.0* 31.9* 29.8*  --   PLT  --   --  94*  --   --  86* 79*  --   APTT 51*  --   --   --   --   --   --   --   LABPROT 15.7*  --   --   --   --   --   --   --   INR 1.24  --   --   --   --   --   --   --   HEPARINUNFRC 0.12* 0.11*  --   --   --   --  0.14* 0.15*  CREATININE  --   --  1.85*  --   --  1.94* 2.39*  --   < > = values in this interval not displayed.  Estimated Creatinine Clearance: 34.7 mL/min (A) (by C-G formula based on SCr of 2.39 mg/dL (H)).   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 Also now has ischemic left leg - no intervention yet per VVS. Heparin level remains low at 0.15. Platelets with slow trend down to 79. No bleeding.  Goal of Therapy:  Heparin level 0.2-0.5 units/ml Monitor platelets by anticoagulation protocol: Yes   Plan:  1) Increase heparin to 1050 units/hr 2) Check heparin level in 8 hours  [name], Saralyn Willison Sue 08/03/2016,2:31 PM

## 2016-08-03 NOTE — Progress Notes (Signed)
eLink Physician-Brief Progress Note Patient Name: Jay AkinRichard Livingston DOB: 07/05/1945 MRN: [id number]   Date of Service  08/03/2016  HPI/Events of Note    eICU Interventions  Hypocalcemia > replace     Intervention Category Intermediate Interventions: Electrolyte abnormality - evaluation and management  [name] 08/03/2016, 5:24 AM

## 2016-08-03 NOTE — Progress Notes (Signed)
PULMONARY / CRITICAL CARE MEDICINE   Name: Jay Livingston MRN: [id number] DOB: February 02, 1946    ADMISSION DATE:  07/13/2016 CONSULTATION DATE:  07/30/2016  REFERRING MD:  Eldridge Dace  CHIEF COMPLAINT:  Cardiac Arrest  HISTORY OF PRESENT ILLNESS:  Pt is encephelopathic; therefore, this HPI is obtained from chart review. Jay Livingston is a 71 y.o. male with PMH as outlined below. On morning of 07/20/2016, he was at work sitting at his desk and was found unresponsive with agonal respirations. He was seen roughly 5 minutes prior to this and was normal.  He had 10 minutes of downtime and when EMS arrived, he was found to be in VT without a pulse.  ACLS was performed for 22 minutes before ROSC.  He was brought to [facility] ED where he was intubated and taken to the cath lab emergently - found to have 3VD without interventions, Impella placed  PCCM was asked to assist with vent management.  SUBJECTIVE:   High residual overnight and TF were stopped.  VITAL SIGNS: BP (!) 73/59   Pulse (!) 207   Temp 98.1 F (36.7 C)   Resp 18   Ht 5\' 10"  (1.778 m)   Wt 228 lb 2.8 oz (103.5 kg)   SpO2 98%   BMI 32.74 kg/m   HEMODYNAMICS: PAP: (41-64)/(22-35) 45/22 CVP:  [5 mmHg-20 mmHg] 5 mmHg PCWP:  [21 mmHg] 21 mmHg CO:  [6.8 L/min-8.6 L/min] 8.6 L/min CI:  [3.1 L/min/m2-3.9 L/min/m2] 3.9 L/min/m2  VENTILATOR SETTINGS: Vent Mode: PCV FiO2 (%):  [60 %-80 %] 70 % Set Rate:  [18 bmp] 18 bmp PEEP:  [10 cmH20-14 cmH20] 10 cmH20 Plateau Pressure:  [20 cmH20-33 cmH20] 25 cmH20  INTAKE / OUTPUT: I/O last 3 completed shifts: In: 7428.2 [I.V.:4828.2; Blood:975; NG/GT:940; IV Piggyback:685] Out: 8490 [Urine:6160; Emesis/NG output:1750; Drains:480; Blood:100]  PHYSICAL EXAMINATION: Gen:      No acute distress, sedated, appears comfortable HEENT:  EOMI, sclera anicteric, ETT  In place Neck:      No masses; no thyromegaly Lungs:    Clear to auscultation bilaterally; normal respiratory effort CV:         Regular rate  and rhythm; no murmurs Abd:      + bowel sounds; soft, non-tender; no palpable masses, no distension Ext:    No edema; Cold extremities. Rt LE and groin wound vac in place. Lt groin IABP Neuro:    Jay Livingston  LABS:  BMET  Recent Labs Lab 08/23/2016 0827  08/10/2016 1255 08/06/2016 1907 08/03/16 0418  NA 129*  < > 134* 128* 129*  K 4.1  < > 4.2 4.6 5.3*  CL 96*  --   --  94* 95*  CO2 23  --   --  24 24  BUN 34*  --   --  34* 38*  CREATININE 1.85*  --   --  1.94* 2.39*  GLUCOSE 152*  --   --  115* 140*  < > = values in this interval not displayed.  Electrolytes  Recent Labs Lab 07/30/16 0501 07/31/16 0403  07/03/2016 1913 08/12/2016 0242 09/01/2016 0827 08/20/2016 1907 08/03/16 0418  CALCIUM 7.4* 6.5*  < > 6.4* 6.7* 6.3* 6.1* 5.9*  MG 2.4 1.7  --  1.8 2.7*  --   --  2.2  PHOS 1.4* 4.8*  --  5.4*  --   --   --   --   < > = values in this interval not displayed.  CBC  Recent Labs Lab 08/28/2016  0827  08-14-2016 1255 Aug 14, 2016 1907 08/03/16 0418  WBC 9.5  --   --  10.0 10.5  HGB 8.1*  < > 10.2* 10.6* 10.1*  HCT 24.1*  < > 30.0* 31.9* 29.8*  PLT 94*  --   --  86* 79*  < > = values in this interval not displayed.  Coag's  Recent Labs Lab 07/31/2016 1105 07/26/2016 2128 08-14-16 0612  APTT 31 63* 51*  INR 1.09 1.18 1.24    Sepsis Markers  Recent Labs Lab August 14, 2016 0310 2016-08-14 0612 08-14-16 1907 2016/08/14 2312 08/03/16 0418  LATICACIDVEN  --  3.6* 1.9 1.5  --   PROCALCITON 5.38  --   --   --  8.25    ABG  Recent Labs Lab 08/14/16 1350 08-14-16 2331 08/03/16 0429  PHART 7.401 7.414 7.419  PCO2ART 43.9 43.0 43.0  PO2ART 98.0 76.0* 63.0*    Liver Enzymes  Recent Labs Lab 07/20/2016 0329 08/14/2016 0242 08/03/16 0418  AST 63* 84* 379*  ALT 56 51 119*  ALKPHOS 54 52 60  BILITOT 1.0 1.2 1.3*  ALBUMIN 2.4* 1.9* 1.6*    Cardiac Enzymes  Recent Labs Lab 07/07/2016 1839 07/30/16 0309 07/30/16 0501  TROPONINI 2.64* 1.98* 1.69*     Glucose  Recent Labs Lab 08/14/16 0739 08/14/2016 1604 2016/08/14 2059 08/14/2016 2328 08/03/16 0427 08/03/16 0736  GLUCAP 151* 173* 114* 113* 147* 128*    Imaging No results found. STUDIES:  Cath 6/27 > No interventions Echo 6/27 > ef 20% EEG 6/27 >   CULTURES: Ucc 6/29 >> Enterococcus Fecalis Bcx 6/29>> Resp Cx 7/1 >>  ANTIBIOTICS: Vanco 6/30>  Zosyn 6/30 >  SIGNIFICANT EVENTS: 6/27 Admit, Hypothermia protocol 6/29 Impella removed 6/30 IABP placed 7/1 Rt LE fasciotomy and enarterectomy  LINES/TUBES: ETT 6/27 >  6/27 LIJ cvl >> Rt femoral impella>> removed 6/29 Lt IABP 6/30 >>  DISCUSSION: 71 y.o. male admitted 6/27 after VT cardiac arrest. Had 10 minutes downtime before ACLS started then 22 minutes prior to ROSC.  Intubated and taken to cath lab emergently. VT again post cath 6/27 and Amio drip  ASSESSMENT / PLAN:  PULMONARY A: Respiratory insufficiency - following VT cardiac arrest. P:   Full vent support Hold weaning Wean PEEP and FiO2 as tolerated CXR and ABG in AM  CARDIOVASCULAR A:  VT arrest - s/p emergent cath 6/27 with 3VD s/p impella placement. VT again 6/27 amio drip started Impella per cards P:  Post procedural management per cardiology. Continue epi, vaso, milrinone and amio drip IABP  RENAL A:   AKI P:   Strict I/O BMET in AM Replace electrolytes as indicated Continue lasix 6 mg/hr IV drip  GASTROINTESTINAL A:   GI prophylaxis. Nutrition. P:   Protonix for SUP. Hold tube feeds  HEMATOLOGIC A:   VTE Prophylaxis. At risk for hypothermia induced coagulopathy. P:  Follow CBC  INFECTIOUS A:   E fecalis UTI Concern for HCAP P:   Continue vanc and zosyn Treat for a total of 8 days  ENDOCRINE  A:   Hyperglycemia P:   On SSI coverage  NEUROLOGIC A:   Acute encephalopathy Following commands after rewarming P:   Fentanyl gtt   Family updated: No family at bedside 7/2 Interdisciplinary Family Meeting v  Palliative Care Meeting:  Due by: 7ds  The patient is critically ill with multiple organ systems failure and requires high complexity decision making for assessment and support, frequent evaluation and titration of therapies, application of advanced monitoring  technologies and extensive interpretation of multiple databases.   Critical Care Time devoted to patient care services described in this note is  35  Minutes. This time reflects time of care of this signee Dr Koren BoundWesam Deashia Soule. This critical care time does not reflect procedure time, or teaching time or supervisory time of PA/NP/Med student/Med Resident etc but could involve care discussion time.  Alyson ReedyWesam G. Fe Okubo, M.D. [facility]. Pager: [phone]. After hours pager: [phone].  08/03/2016, 8:31 AM

## 2016-08-04 ENCOUNTER — Inpatient Hospital Stay (HOSPITAL_COMMUNITY): Payer: Medicare Other

## 2016-08-04 DIAGNOSIS — G931 Anoxic brain damage, not elsewhere classified: Secondary | ICD-10-CM

## 2016-08-04 DIAGNOSIS — N179 Acute kidney failure, unspecified: Secondary | ICD-10-CM

## 2016-08-04 LAB — BLOOD GAS, ARTERIAL
Acid-base deficit: 0.8 mmol/L (ref 0.0–2.0)
BICARBONATE: 23.4 mmol/L (ref 20.0–28.0)
FIO2: 70
O2 Saturation: 98 %
PEEP/CPAP: 10 cmH2O
Patient temperature: 98.6
RATE: 18 resp/min
pCO2 arterial: 38.8 mmHg (ref 32.0–48.0)
pH, Arterial: 7.398 (ref 7.350–7.450)
pO2, Arterial: 115 mmHg — ABNORMAL HIGH (ref 83.0–108.0)

## 2016-08-04 LAB — TYPE AND SCREEN
ABO/RH(D): O POS
ANTIBODY SCREEN: NEGATIVE
UNIT DIVISION: 0
UNIT DIVISION: 0
Unit division: 0
Unit division: 0

## 2016-08-04 LAB — COOXEMETRY PANEL
Carboxyhemoglobin: 0.8 % (ref 0.5–1.5)
Methemoglobin: 1.5 % (ref 0.0–1.5)
O2 SAT: 75.7 %
Total hemoglobin: 8.5 g/dL — ABNORMAL LOW (ref 12.0–16.0)

## 2016-08-04 LAB — BPAM RBC
BLOOD PRODUCT EXPIRATION DATE: 201807252359
Blood Product Expiration Date: 201807252359
Blood Product Expiration Date: 201807252359
Blood Product Expiration Date: 201807252359
ISSUE DATE / TIME: 201807011018
ISSUE DATE / TIME: 201807011018
ISSUE DATE / TIME: 201807011018
ISSUE DATE / TIME: 201807011018
UNIT TYPE AND RH: 5100
UNIT TYPE AND RH: 5100
Unit Type and Rh: 5100
Unit Type and Rh: 5100

## 2016-08-04 LAB — PHOSPHORUS: Phosphorus: 8.1 mg/dL — ABNORMAL HIGH (ref 2.5–4.6)

## 2016-08-04 LAB — CBC
HCT: 28.4 % — ABNORMAL LOW (ref 39.0–52.0)
HEMOGLOBIN: 9.5 g/dL — AB (ref 13.0–17.0)
MCH: 29.1 pg (ref 26.0–34.0)
MCHC: 33.5 g/dL (ref 30.0–36.0)
MCV: 86.9 fL (ref 78.0–100.0)
PLATELETS: 77 10*3/uL — AB (ref 150–400)
RBC: 3.27 MIL/uL — AB (ref 4.22–5.81)
RDW: 16 % — ABNORMAL HIGH (ref 11.5–15.5)
WBC: 11.2 10*3/uL — ABNORMAL HIGH (ref 4.0–10.5)

## 2016-08-04 LAB — GLUCOSE, CAPILLARY
GLUCOSE-CAPILLARY: 105 mg/dL — AB (ref 65–99)
GLUCOSE-CAPILLARY: 143 mg/dL — AB (ref 65–99)
Glucose-Capillary: 91 mg/dL (ref 65–99)
Glucose-Capillary: 107 mg/dL — ABNORMAL HIGH (ref 65–99)
Glucose-Capillary: 152 mg/dL — ABNORMAL HIGH (ref 65–99)

## 2016-08-04 LAB — HEPARIN LEVEL (UNFRACTIONATED)
HEPARIN UNFRACTIONATED: 0.28 [IU]/mL — AB (ref 0.30–0.70)
Heparin Unfractionated: 0.28 IU/mL — ABNORMAL LOW (ref 0.30–0.70)

## 2016-08-04 LAB — COMPREHENSIVE METABOLIC PANEL
ALK PHOS: 70 U/L (ref 38–126)
ALT: 174 U/L — AB (ref 17–63)
ANION GAP: 14 (ref 5–15)
AST: 650 U/L — ABNORMAL HIGH (ref 15–41)
Albumin: 1.5 g/dL — ABNORMAL LOW (ref 3.5–5.0)
BILIRUBIN TOTAL: 1.5 mg/dL — AB (ref 0.3–1.2)
BUN: 47 mg/dL — ABNORMAL HIGH (ref 6–20)
CALCIUM: 6 mg/dL — AB (ref 8.9–10.3)
CO2: 21 mmol/L — AB (ref 22–32)
CREATININE: 3.29 mg/dL — AB (ref 0.61–1.24)
Chloride: 93 mmol/L — ABNORMAL LOW (ref 101–111)
GFR, EST AFRICAN AMERICAN: 20 mL/min — AB (ref >60)
GFR, EST NON AFRICAN AMERICAN: 18 mL/min — AB (ref >60)
Glucose, Bld: 96 mg/dL (ref 65–99)
Potassium: 5.5 mmol/L — ABNORMAL HIGH (ref 3.5–5.1)
SODIUM: 128 mmol/L — AB (ref 135–145)
TOTAL PROTEIN: 4.5 g/dL — AB (ref 6.5–8.1)

## 2016-08-04 LAB — POTASSIUM: Potassium: 5.5 mmol/L — ABNORMAL HIGH (ref 3.5–5.1)

## 2016-08-04 LAB — VANCOMYCIN, TROUGH: VANCOMYCIN TR: 27 ug/mL — AB (ref 15–20)

## 2016-08-04 LAB — PROCALCITONIN: PROCALCITONIN: 9.56 ng/mL

## 2016-08-04 LAB — MAGNESIUM: MAGNESIUM: 2.4 mg/dL (ref 1.7–2.4)

## 2016-08-04 MED ORDER — FUROSEMIDE 10 MG/ML IJ SOLN
10.0000 mg/h | INTRAMUSCULAR | Status: DC
  Administered 2016-08-05: 10 mg/h via INTRAVENOUS
  Filled 2016-08-04: qty 25
  Filled 2016-08-04: qty 5

## 2016-08-04 MED ORDER — SODIUM CHLORIDE 0.9 % IV SOLN
2.0000 g | Freq: Once | INTRAVENOUS | Status: AC
  Administered 2016-08-04: 2 g via INTRAVENOUS
  Filled 2016-08-04: qty 20

## 2016-08-04 MED ORDER — DOCUSATE SODIUM 50 MG/5ML PO LIQD
100.0000 mg | Freq: Every day | ORAL | Status: AC
  Administered 2016-08-04: 100 mg
  Filled 2016-08-04: qty 10

## 2016-08-04 MED ORDER — SORBITOL 70 % SOLN
30.0000 mL | Freq: Once | Status: AC
  Administered 2016-08-04: 30 mL
  Filled 2016-08-04: qty 30

## 2016-08-04 NOTE — Procedures (Signed)
ELECTROENCEPHALOGRAM REPORT  Date of Study: 08/04/2016  Patient's Name: Jay Livingston MRN: [id number] Date of Birth: 16-Oct-1945  Referring Provider: Rush Farmer, MD  Clinical History: 71 year old man status post cardiac arrest.  Medications: acetaminophen (TYLENOL) tablet 650 mg  aspirin chewable tablet 81 mg  chlorhexidine gluconate (MEDLINE KIT) (PERIDEX) 0.12 % solution 15 mL  docusate (COLACE) 50 MG/5ML liquid 100 mg  EPINEPHrine (ADRENALIN) 4 mg in dextrose 5 % 250 mL (0.016 mg/mL) infusion  furosemide (LASIX) 250 mg in dextrose 5 % 250 mL (1 mg/mL) infusion  heparin ADULT infusion 100 units/mL (25000 units/244m sodium chloride 0.45%)  insulin aspart (novoLOG) injection 0-20 Units  insulin aspart (novoLOG) injection 1 Units  insulin glargine (LANTUS) injection 10 Units  midazolam (VERSED) 50 mg in sodium chloride 0.9 % 50 mL (1 mg/mL) infusion  milrinone (PRIMACOR) 20 MG/100 ML (0.2 mg/mL) infusion  norepinephrine (LEVOPHED) 16 mg in dextrose 5 % 250 mL (0.064 mg/mL) infusion  ondansetron (ZOFRAN) injection 4 mg  pantoprazole sodium (PROTONIX) 40 mg/20 mL oral suspension 40 mg  piperacillin-tazobactam (ZOSYN) IVPB 3.375 g   pneumococcal 23 valent vaccine (PNU-IMMUNE) injection 0.5 mL  vancomycin (VANCOCIN) 1,250 mg in sodium chloride 0.9 % 250 mL IVPB  vasopressin (PITRESSIN) 40 Units in sodium chloride 0.9 % 250 mL (0.16 Units/mL) infusion  Technical Summary: A multichannel digital EEG recording measured by the international 10-20 system with electrodes applied with paste and impedances below 5000 ohms performed in our laboratory with EKG monitoring in an intubated and non-sedated patient.  Hyperventilation and photic stimulation were not performed.  The digital EEG was referentially recorded, reformatted, and digitally filtered in a variety of bipolar and referential montages for optimal display.    Description: The patient is intubated, not responsive, and off of  sedation during the recording. There is diffuse and symmetric low amplitude 2-3 Hz delta with some 4-5 Hz theta slowing.  There is no posterior dominant rhythm.  Stage 2 sleep was not seen.  There were no epileptiform discharges or electrographic seizures seen.   EKG lead was unremarkable.  Impression: This EEG is markedly abnormal due to diffuse background suppression and lack of EEG reactivity with noxious stimulation.  Clinical Correlation of the above findings indicates severe diffuse cerebral dysfunction that is non-specific in etiology and can be seen in the setting of anoxic/ischemic injury, toxic/metabolic encephalopathies, or medication effect. Clinical correlation is advised.  AMetta Clines DO

## 2016-08-04 NOTE — Progress Notes (Signed)
Dr. Virgina OrganQureshi notified of morning lab results. Orders for 2 g Calcium Gluconate IVPB and a potassium lab redraw at 1000. Will implement orders and continue to monitor.

## 2016-08-04 NOTE — Progress Notes (Signed)
   VASCULAR SURGERY ASSESSMENT & PLAN:   POD 2: RIGHT FEMORAL THROMBECTOMY RIGHT COMMON FEMORAL ARTERY ENDARTERECTOMY VEIN PATCH ANGIOPLASTY OF THE RIGHT COMMON FEMORAL ARTERY 4 COMPARTMENT FASCIOTOMY AND PLACEMENT OF NEGATIVE PRESSURE DRESSINGS   The right foot has good Doppler flow. The VAC's have a good seal.   Now has a posterior signal with the doppler in the left foot. Still dependent upon the IABP. On pressors still. Will follow.    VAC change on Monday Wednesday Friday.   JP 50 cc right groin. On heparin. Leave another day.    SUBJECTIVE:   Sedated on vent.  PHYSICAL EXAM:   Vitals:   08/04/16 0900 08/04/16 1000 08/04/16 1100 08/04/16 1101  BP: (!) 85/54 101/69 (!) 99/47 (!) 94/44  Pulse: 100 (!) 158 100 100  Resp: '18 18 17 20  '$ Temp: 98.2 F (36.8 C) 98.4 F (36.9 C) 98.2 F (36.8 C)   TempSrc:      SpO2: 98% 98% 98% 98%  Weight:      Height:       Doppler PT right LE Doppler PT left LE  LABS:   Lab Results  Component Value Date   WBC 11.2 (H) 08/04/2016   HGB 9.5 (L) 08/04/2016   HCT 28.4 (L) 08/04/2016   MCV 86.9 08/04/2016   PLT 77 (L) 08/04/2016   Lab Results  Component Value Date   CREATININE 3.29 (H) 08/04/2016   Lab Results  Component Value Date   INR 1.24 09/01/2016   CBG (last 3)   Recent Labs  08/04/16 0304 08/04/16 0755 08/04/16 1140  GLUCAP 107* 105* 143*    PROBLEM LIST:    Active Problems:   Cardiac arrest (HCC)   Ventricular tachycardia ([place])   Coronary artery disease involving native coronary artery   Pressure injury of skin   Cardiogenic shock (HCC)   Acute respiratory failure with hypoxia (HCC)   CURRENT MEDS:   . aspirin  81 mg Oral Daily  . chlorhexidine gluconate (MEDLINE KIT)  15 mL Mouth Rinse BID  . Chlorhexidine Gluconate Cloth  6 each Topical Daily  . feeding supplement (PRO-STAT SUGAR FREE 64)  30 mL Per Tube 5 X Daily  . feeding supplement (VITAL HIGH PROTEIN)  1,000 mL Per Tube Q24H  .  fentaNYL (SUBLIMAZE) injection  50 mcg Intravenous Once  . insulin aspart  0-20 Units Subcutaneous Q4H  . insulin aspart  1 Units Subcutaneous Q4H  . insulin glargine  10 Units Subcutaneous Daily  . mouth rinse  15 mL Mouth Rinse 10 times per day  . pantoprazole sodium  40 mg Per Tube Daily  . sodium chloride flush  10-40 mL Intracatheter Q12H  . sodium chloride flush  3 mL Intravenous Q12H    Gae Gallop Beeper: [phone] Office: [phone] 08/04/2016

## 2016-08-04 NOTE — Progress Notes (Signed)
PULMONARY / CRITICAL CARE MEDICINE   Name: Jay Livingston MRN: [id number] DOB: 08-07-1945    ADMISSION DATE:  07/14/2016 CONSULTATION DATE:  07/28/2016  REFERRING MD:  Eldridge Dace  CHIEF COMPLAINT:  Cardiac Arrest  HISTORY OF PRESENT ILLNESS:  Pt is encephelopathic; therefore, this HPI is obtained from chart review. Jay Livingston is a 71 y.o. male with PMH as outlined below. On morning of 07/11/2016, he was at work sitting at his desk and was found unresponsive with agonal respirations. He was seen roughly 5 minutes prior to this and was normal.  He had 10 minutes of downtime and when EMS arrived, he was found to be in VT without a pulse.  ACLS was performed for 22 minutes before ROSC.  He was brought to [facility] ED where he was intubated and taken to the cath lab emergently - found to have 3VD without interventions, Impella placed  PCCM was asked to assist with vent management.  SUBJECTIVE:   No events overnight, remains unresponsive  VITAL SIGNS: BP (!) 85/54   Pulse 100   Temp 98.2 F (36.8 C)   Resp 18   Ht 5\' 10"  (1.778 m)   Wt 236 lb 1.8 oz (107.1 kg)   SpO2 98%   BMI 33.88 kg/m   HEMODYNAMICS: PAP: (41-62)/(19-29) 51/24 CVP:  [6 mmHg-13 mmHg] 8 mmHg PCWP:  [22 mmHg-24 mmHg] 22 mmHg CO:  [5.8 L/min-9.9 L/min] 9.9 L/min CI:  [2.6 L/min/m2-4.5 L/min/m2] 4.5 L/min/m2  VENTILATOR SETTINGS: Vent Mode: PCV FiO2 (%):  [70 %-80 %] 80 % Set Rate:  [18 bmp] 18 bmp PEEP:  [10 cmH20] 10 cmH20 Plateau Pressure:  [21 cmH20-26 cmH20] 22 cmH20  INTAKE / OUTPUT: I/O last 3 completed shifts: In: 4315.8 [I.V.:3600.8; Other:10; NG/GT:150; IV Piggyback:555] Out: 5840 [Urine:3905; Emesis/NG output:1550; Drains:385]  PHYSICAL EXAMINATION: Gen:      No acute distress, sedated, appears comfortable HEENT:  EOMI, sclera anicteric, ETT  In place Neck:      No masses; no thyromegaly Lungs:    Clear to auscultation bilaterally; normal respiratory effort CV:         RRR, Nl S1/S2, -M/R/G. Abd:       + bowel sounds; soft, non-tender; no palpable masses, no distension Ext:    No edema; Cold extremities. Rt LE and groin wound vac in place. Lt groin IABP Neuro:    Orson Aloe  LABS:  BMET  Recent Labs Lab 08/03/16 0418 08/03/16 1747 08/04/16 0259  NA 129* 129* 128*  K 5.3* 5.2* 5.5*  CL 95* 94* 93*  CO2 24 23 21*  BUN 38* 43* 47*  CREATININE 2.39* 2.87* 3.29*  GLUCOSE 140* 64* 96   Electrolytes  Recent Labs Lab 07/31/16 0403  07/17/2016 1913 2016-08-03 0242  08/03/16 0418 08/03/16 1747 08/04/16 0259  CALCIUM 6.5*  < > 6.4* 6.7*  < > 5.9* 6.5* 6.0*  MG 1.7  --  1.8 2.7*  --  2.2  --  2.4  PHOS 4.8*  --  5.4*  --   --   --   --  8.1*  < > = values in this interval not displayed.  CBC  Recent Labs Lab 08/03/16 0418 08/03/16 1747 08/04/16 0259  WBC 10.5 12.3* 11.2*  HGB 10.1* 9.8* 9.5*  HCT 29.8* 29.4* 28.4*  PLT 79* 91* 77*   Coag's  Recent Labs Lab 07/22/2016 1105 07/31/2016 2128 08/03/2016 0612  APTT 31 63* 51*  INR 1.09 1.18 1.24   Sepsis Markers  Recent Labs  Lab 08/07/2016 0310  08/25/2016 1907 08/22/2016 2312 08/03/16 0418 08/03/16 0900 08/04/16 0259  LATICACIDVEN  --   < > 1.9 1.5  --  1.6  --   PROCALCITON 5.38  --   --   --  8.25  --  9.56  < > = values in this interval not displayed.  ABG  Recent Labs Lab 08/24/2016 2331 08/03/16 0429 08/04/16 0310  PHART 7.414 7.419 7.398  PCO2ART 43.0 43.0 38.8  PO2ART 76.0* 63.0* 115*   Liver Enzymes  Recent Labs Lab 08/27/2016 0242 08/03/16 0418 08/04/16 0259  AST 84* 379* 650*  ALT 51 119* 174*  ALKPHOS 52 60 70  BILITOT 1.2 1.3* 1.5*  ALBUMIN 1.9* 1.6* 1.5*   Cardiac Enzymes  Recent Labs Lab August 28, 2016 1839 07/30/16 0309 07/30/16 0501  TROPONINI 2.64* 1.98* 1.69*   Glucose  Recent Labs Lab 08/03/16 1543 08/03/16 2017 08/03/16 2056 08/03/16 2335 08/04/16 0304 08/04/16 0755  GLUCAP 74 65 100* 91 107* 105*   Imaging Dg Chest Port 1 View  Result Date:  08/04/2016 CLINICAL DATA:  Followup cardiac arrest. EXAM: PORTABLE CHEST 1 VIEW COMPARISON:  08/28/2016 FINDINGS: Endotracheal tube tip is 5 cm above the carina. Nasogastric tube enters the stomach. Swan-Ganz catheter introduced from a lower extremity approach has its tip in the right middle or lower lobe pulmonary artery. Left internal jugular central line tip at the innominate SVC junction. Aortic balloon pump projects 3 cm lower than the aortic arch. Mild pulmonary edema persists. There is worsened volume loss in the lower lobes left worse than right. IMPRESSION: Lines and tubes well positioned as above. Mild persistent edema. Worsened volume loss in the lower lobes left worse than right. Electronically Signed   By: Paulina Fusi M.D.   On: 08/04/2016 07:29   STUDIES:  Cath 6/27 > No interventions Echo 6/27 > ef 20% EEG 6/27 > Inconlusive  CULTURES: Ucc 6/29 >> Enterococcus Fecalis Bcx 6/29>> NTD Resp Cx 7/1 >> GPCs  ANTIBIOTICS: Vanco 6/30>  Zosyn 6/30 >  SIGNIFICANT EVENTS: 6/27 Admit, Hypothermia protocol 6/29 Impella removed 6/30 IABP placed 7/1 Rt LE fasciotomy and enarterectomy  LINES/TUBES: ETT 6/27 >  6/27 LIJ cvl >> Rt femoral impella>> removed 6/29 Lt IABP 6/30 (1:2) >>   DISCUSSION: 71 y.o. male admitted 6/27 after VT cardiac arrest. Had 10 minutes downtime before ACLS started then 22 minutes prior to ROSC.  Intubated and taken to cath lab emergently. VT again post cath 6/27 and Amio drip  ASSESSMENT / PLAN:  PULMONARY A: Respiratory insufficiency - following VT cardiac arrest. P:   Continue full vent support given mental status and recent rewarm Wean PEEP and FiO2 as tolerated (70% and 10). CXR and ABG in AM Titrate O2 for sat of 88-92%  CARDIOVASCULAR A:  VT arrest - s/p emergent cath 6/27 with 3VD s/p impella placement. VT again 6/27 amio drip started Impella per cards P:  Post procedural management per cardiology. Continue epi, vaso, milrinone and amio  drip and titrate as able IABP down to 1:2  RENAL A:   AKI P:   Strict I/O BMET in AM Replace electrolytes as indicated Cards increased lasix drip to 10 mg/hr Colace added (did not defecate after being given kayexalate and K is rising). Anticipate will need dialysis if we are to continue support.  GASTROINTESTINAL A:   GI prophylaxis. Nutrition. P:   Protonix for SUP. Hold tube feeds since on epi, will consider restart in AM  HEMATOLOGIC A:  VTE Prophylaxis. At risk for hypothermia induced coagulopathy. P:  Follow CBC Transfuse per ICU protocol  INFECTIOUS A:   E fecalis UTI Concern for HCAP P:   Continue vanc and zosyn Treat for a total of 8 days  ENDOCRINE  A:   Hyperglycemia P:   On SSI coverage  NEUROLOGIC A:   Acute encephalopathy Following commands after rewarming P:   Fentanyl gtt  Vered gtt  Both above held for neuro assessment New EEG ordered, pending results may need neuro to evaluate.  Family updated: No family at bedside 7/3  Interdisciplinary Family Meeting v Palliative Care Meeting:  Due by: 7ds  The patient is critically ill with multiple organ systems failure and requires high complexity decision making for assessment and support, frequent evaluation and titration of therapies, application of advanced monitoring technologies and extensive interpretation of multiple databases.   Critical Care Time devoted to patient care services described in this note is  35  Minutes. This time reflects time of care of this signee Dr Koren BoundWesam Yacoub. This critical care time does not reflect procedure time, or teaching time or supervisory time of PA/NP/Med student/Med Resident etc but could involve care discussion time.  Alyson ReedyWesam G. Yacoub, M.D. [facility]. Pager: [phone]. After hours pager: [phone].  08/04/2016, 9:55 AM

## 2016-08-04 NOTE — Progress Notes (Signed)
ANTICOAGULATION CONSULT NOTE - Follow Up Consult  Pharmacy Consult for Heparin  Indication: IABP   No Known Allergies  Patient Measurements: Height: 5\' 10"  (177.8 cm) Weight: 236 lb 1.8 oz (107.1 kg) IBW/kg (Calculated) : 73 Heparin Dosing Weight: 95kg  Vital Signs: Temp: 97.7 F (36.5 C) (07/03 1700) Temp Source: Core (Comment) (07/03 0700) BP: 112/75 (07/03 1700) Pulse Rate: 190 (07/03 1700)  Labs:  Recent Labs  08/28/2016 0612  08/03/16 0418  08/03/16 1747 08/03/16 2230 08/04/16 0259 08/04/16 0807 08/04/16 1615  HGB  --   < > 10.1*  --  9.8*  --  9.5*  --   --   HCT  --   < > 29.8*  --  29.4*  --  28.4*  --   --   PLT  --   < > 79*  --  91*  --  77*  --   --   APTT 51*  --   --   --   --   --   --   --   --   LABPROT 15.7*  --   --   --   --   --   --   --   --   INR 1.24  --   --   --   --   --   --   --   --   HEPARINUNFRC 0.12*  < > 0.14*  < >  --  0.18*  --  0.28* 0.28*  CREATININE  --   < > 2.39*  --  2.87*  --  3.29*  --   --   < > = values in this interval not displayed.  Estimated Creatinine Clearance: 25.6 mL/min (A) (by C-G formula based on SCr of 3.29 mg/dL (H)).   Medications:  Heparin @ 1250 units/hr  Assessment: 70 yom continues on heparin for IABP, s/p right femoral artery thrombectomy 7/1. Also now has ischemic left leg - no intervention yet per VVS - perfusion actually improved today. Heparin level remains therapeutic at 0.28. Platelets with slow trend down to 71. No bleeding.  Goal of Therapy:  Heparin level 0.2-0.5 units/ml Monitor platelets by anticoagulation protocol: Yes   Plan:  1) Continue heparin at 1250 units/hr 2) Follow up daily heparin level and CBC  Nealy Hickmon, Pharm.D., BCPS Clinical Pharmacist Pager: [phone] 08/04/2016 5:21 PM

## 2016-08-04 NOTE — Progress Notes (Signed)
Inpatient Diabetes Program Recommendations  AACE/ADA: New Consensus Statement on Inpatient Glycemic Control (2015)  Target Ranges:  Prepandial:   less than 140 mg/dL      Peak postprandial:   less than 180 mg/dL (1-2 hours)      Critically ill patients:  140 - 180 mg/dL   Lab Results  Component Value Date   GLUCAP 105 (H) 08/04/2016   HGBA1C 7.6 (H) 07/28/2016    Review of Glycemic ControlResults for Jay Livingston, Jay Livingston (MRN [id number]) as of 08/04/2016 09:13  Ref. Range 08/03/2016 20:17 08/03/2016 20:56 08/03/2016 23:35 08/04/2016 03:04 08/04/2016 07:55  Glucose-Capillary Latest Ref Range: 65 - 99 mg/dL 65 706100 (H) 91 237107 (H) 628105 (H)   Inpatient Diabetes Program Recommendations:   Please consider reducing Novolog correction to sensitive q 4 hours.   Thanks, Beryl MeagerJenny Khristie Sak, RN, BC-ADM Inpatient Diabetes Coordinator Pager [phone] (8a-5p)

## 2016-08-04 NOTE — Progress Notes (Signed)
EEG Completed; Results Pending  

## 2016-08-04 NOTE — Progress Notes (Signed)
Dr. Edilia Boickson at bedside to assess medial fasciotomy site. Wound vac removed due to clots and blockage of the system. Small portion of muscle with darker discoloration concerning and bulging around site. No new orders received. Wound vac reapplied will continue to monitor.

## 2016-08-04 NOTE — Progress Notes (Addendum)
ANTICOAGULATION CONSULT NOTE - Follow Up Consult ANTIBIOTIC CONSULT NOTE - Follow Up Consult  Pharmacy Consult for Heparin and Vancomycin/Zosyn Indication: IABP and HCAP/UTI  No Known Allergies  Patient Measurements: Height: 5\' 10"  (177.8 cm) Weight: 236 lb 1.8 oz (107.1 kg) IBW/kg (Calculated) : 73 Heparin Dosing Weight: 95kg  Vital Signs: Temp: 98.2 F (36.8 C) (07/03 1100) Temp Source: Core (Comment) (07/03 0700) BP: 99/47 (07/03 1100) Pulse Rate: 100 (07/03 1100)  Labs:  Recent Labs  08/20/2016 0612  08/03/16 0418 08/03/16 1330 08/03/16 1747 08/03/16 2230 08/04/16 0259 08/04/16 0807  HGB  --   < > 10.1*  --  9.8*  --  9.5*  --   HCT  --   < > 29.8*  --  29.4*  --  28.4*  --   PLT  --   < > 79*  --  91*  --  77*  --   APTT 51*  --   --   --   --   --   --   --   LABPROT 15.7*  --   --   --   --   --   --   --   INR 1.24  --   --   --   --   --   --   --   HEPARINUNFRC 0.12*  < > 0.14* 0.15*  --  0.18*  --  0.28*  CREATININE  --   < > 2.39*  --  2.87*  --  3.29*  --   < > = values in this interval not displayed.  Estimated Creatinine Clearance: 25.6 mL/min (A) (by C-G formula based on SCr of 3.29 mg/dL (H)).   Medications:  Heparin @ 1250 units/hr  Assessment: 70yom continues on heparin for IABP, s/p right femoral artery thrombectomy 7/1. Also now has ischemic left leg - no intervention yet per VVS - perfusion actually improved today. Heparin level is finally therapeutic at 0.28. Platelets with slow trend down to 71. No bleeding.  He also continues on day #5 vancomycin and zosyn for HCAP and enterococcal UTI. Vancomycin trough obtained today is supratherapeutic at 27. Cr is worsening, up to 3.29 with CrCl ~ 1825ml/min. He is still making some urine and lasix drip increased to 10mg /h. Zosyn dose remains appropriate.  Vancomycin 6/30>> Zosyn 6/30>>  6/29 TA: gnr/gpc- normal flora 6/29 bld x2: ngtd 6/29 urine: 100K enterococcus 6/29 MRSA PCR: MRSA neg, staph  aureus + 7/1 resp: rare staph aureus  Goal of Therapy:  Vancomycin trough 15-20 Heparin level 0.2-0.5 units/ml Monitor platelets by anticoagulation protocol: Yes   Plan:  1) Continue heparin at 1250 units/hr 2) Check 8 hour level to confirm 3) Hold vancomycin and check random level tomorrow morning 4) Continue zosyn 3.375g IV q8 (4 hour infusion) 5) Continue to follow renal function, cultures, LOT  [name], Jay Livingston 08/04/2016,11:54 AM

## 2016-08-04 NOTE — Progress Notes (Signed)
Advanced Heart Failure Rounding Note  Primary Cardiologist: New  Subjective:    Impella pulled 6/29 due to inability to move R leg.  Last night developed recurrent VT/VF receiving several shocks. Developed progressive shock, acidosis and hypoxemia despite increasing pressors.   He was taken emergently to cath lab 6/30 for left femoral IABP and swan as a salvage attempt.   On 7/1 taken to OR for ischemic RLE. R femoral artery occluded with clot and plaque. Underwent emergent  thrombectomy and endarectomy with 4-compartment fasciotomy.   Coox 75.7% this am. Epi 2 and milrinone 0.25 mcg/kg/min. Remains on amio gtt.   Remains on ventilator. Up to 80% with "plugging" ET this am. Improved s/p lavage.  Creatinine 3.29. K 5.5. Calcium 6.0. Na 128. Remains on lasix gtt at 6 mg/hr. Decent u/o  ABG  7.398/39/115/98%   Lactate 1.2 yesterday.  Tmax 98.6.   CXR this am with persistent edema and lower lobe volume loss (L>R)  CVP 6 PA 46/22 PCW 22 (PA diastolic) CO/CI 6.7/ 3.41  Objective:   Weight Range: 236 lb 1.8 oz (107.1 kg) Body mass index is 33.88 kg/m.   Vital Signs:   Temp:  [97.7 F (36.5 C)-98.6 F (37 C)] 97.9 F (36.6 C) (07/03 0715) Pulse Rate:  [97-239] 201 (07/03 0715) Resp:  [16-23] 16 (07/03 0715) BP: (73-134)/(39-75) 110/64 (07/03 0700) SpO2:  [95 %-100 %] 97 % (07/03 0715) Arterial Line BP: (65-135)/(38-69) 75/48 (07/03 0715) FiO2 (%):  [70 %-80 %] 80 % (07/03 0715) Weight:  [236 lb 1.8 oz (107.1 kg)] 236 lb 1.8 oz (107.1 kg) (07/03 0500) Last BM Date:  (PTA)  Weight change: Filed Weights   07/31/16 0500 07/20/2016 0400 08/04/16 0500  Weight: 216 lb 0.8 oz (98 kg) 228 lb 2.8 oz (103.5 kg) 236 lb 1.8 oz (107.1 kg)    Intake/Output:   Intake/Output Summary (Last 24 hours) at 08/04/16 0759 Last data filed at 08/04/16 0700  Gross per 24 hour  Intake          2889.77 ml  Output             3245 ml  Net          -355.23 ml      Physical Exam    General: Intubated. Critically ill appearing.  HEENT: +ETT Neck: Supple. RIJ TLC. Carotids 2+ bilat; no bruits. No thyromegaly or nodule noted. Cor: PMI nondisplaced. RRR, No M/G/R noted Lungs: CTAB, normal effort. Abdomen: Soft, non-tender, ++ distended, no HSM. No bruits or masses. +BS  Extremities: Markedly edematous. RLE now warm with dopplerable pulse. R groin wound packed with JP drain. RLE fasciotomy wounds with vac. LLE cool and mottled no dopplerable pulses. Left groin with IABP and swan. Improving ecchymosis. ++ peripheral edema. Neuro: Sedated. Awakens at times.   Telemetry   Personally reviewed, NSR with PVCs.   EKG    Not ordered today.    Labs    CBC  Recent Labs  08/03/16 1747 08/04/16 0259  WBC 12.3* 11.2*  HGB 9.8* 9.5*  HCT 29.4* 28.4*  MCV 87.2 86.9  PLT 91* 77*   Basic Metabolic Panel  Recent Labs  07/19/2016 1913  08/03/16 0418 08/03/16 1747 08/04/16 0259  NA 125*  < > 129* 129* 128*  K 5.7*  < > 5.3* 5.2* 5.5*  CL 97*  < > 95* 94* 93*  CO2 21*  < > 24 23 21*  GLUCOSE 350*  < > 140* 64*  96  BUN 29*  < > 38* 43* 47*  CREATININE 1.78*  < > 2.39* 2.87* 3.29*  CALCIUM 6.4*  < > 5.9* 6.5* 6.0*  MG 1.8  < > 2.2  --  2.4  PHOS 5.4*  --   --   --  8.1*  < > = values in this interval not displayed. Liver Function Tests  Recent Labs  08/03/16 0418 08/04/16 0259  AST 379* 650*  ALT 119* 174*  ALKPHOS 60 70  BILITOT 1.3* 1.5*  PROT 4.4* 4.5*  ALBUMIN 1.6* 1.5*   No results for input(s): LIPASE, AMYLASE in the last 72 hours. Cardiac Enzymes No results for input(s): CKTOTAL, CKMB, CKMBINDEX, TROPONINI in the last 72 hours.  BNP: BNP (last 3 results) No results for input(s): BNP in the last 8760 hours.  ProBNP (last 3 results) No results for input(s): PROBNP in the last 8760 hours.   D-Dimer No results for input(s): DDIMER in the last 72 hours. Hemoglobin A1C No results for input(s): HGBA1C in the last 72 hours. Fasting Lipid  Panel No results for input(s): CHOL, HDL, LDLCALC, TRIG, CHOLHDL, LDLDIRECT in the last 72 hours. Thyroid Function Tests No results for input(s): TSH, T4TOTAL, T3FREE, THYROIDAB in the last 72 hours.  Invalid input(s): FREET3  Other results:   Imaging    Dg Chest Port 1 View  Result Date: 08/04/2016 CLINICAL DATA:  Followup cardiac arrest. EXAM: PORTABLE CHEST 1 VIEW COMPARISON:  08/03/2016 FINDINGS: Endotracheal tube tip is 5 cm above the carina. Nasogastric tube enters the stomach. Swan-Ganz catheter introduced from a lower extremity approach has its tip in the right middle or lower lobe pulmonary artery. Left internal jugular central line tip at the innominate SVC junction. Aortic balloon pump projects 3 cm lower than the aortic arch. Mild pulmonary edema persists. There is worsened volume loss in the lower lobes left worse than right. IMPRESSION: Lines and tubes well positioned as above. Mild persistent edema. Worsened volume loss in the lower lobes left worse than right. Electronically Signed   By: Paulina Fusi M.D.   On: 08/04/2016 07:29     Medications:     Scheduled Medications: . aspirin  81 mg Oral Daily  . chlorhexidine gluconate (MEDLINE KIT)  15 mL Mouth Rinse BID  . Chlorhexidine Gluconate Cloth  6 each Topical Daily  . feeding supplement (PRO-STAT SUGAR FREE 64)  30 mL Per Tube 5 X Daily  . feeding supplement (VITAL HIGH PROTEIN)  1,000 mL Per Tube Q24H  . fentaNYL (SUBLIMAZE) injection  50 mcg Intravenous Once  . insulin aspart  0-20 Units Subcutaneous Q4H  . insulin aspart  1 Units Subcutaneous Q4H  . insulin glargine  10 Units Subcutaneous Daily  . mouth rinse  15 mL Mouth Rinse 10 times per day  . pantoprazole sodium  40 mg Per Tube Daily  . sodium chloride flush  10-40 mL Intracatheter Q12H  . sodium chloride flush  3 mL Intravenous Q12H    Infusions: . sodium chloride 250 mL (08/04/16 0700)  . sodium chloride 10 mL/hr at 08/03/16 1900  . amiodarone 30  mg/hr (08/04/16 0700)  . dextrose    . epinephrine 2 mcg/min (08/04/16 0700)  . fentaNYL infusion INTRAVENOUS 300 mcg/hr (08/04/16 0700)  . furosemide (LASIX) infusion 6 mg/hr (08/04/16 0700)  . heparin 1,250 Units/hr (08/04/16 0700)  . midazolam (VERSED) infusion 5 mg/hr (08/04/16 0700)  . milrinone 0.25 mcg/kg/min (08/04/16 0700)  . norepinephrine (LEVOPHED) Adult infusion Stopped (07/30/2016  2353)  . piperacillin-tazobactam (ZOSYN)  IV 3.375 g (08/04/16 0510)  . vancomycin Stopped (08/03/16 1207)  . vasopressin (PITRESSIN) infusion - *FOR SHOCK* Stopped (08/03/16 2110)    PRN Medications: sodium chloride, acetaminophen, dextrose, fentaNYL (SUBLIMAZE) injection, midazolam, ondansetron (ZOFRAN) IV, pneumococcal 23 valent vaccine, sodium chloride flush    Patient Profile   Mr Spiewak is a 44 year with no past medical history admitted with cardiac/VF arrest. Cath with severe (chronic 3v CAD). Impella placed. Underwent hypothermia protocol.    Assessment/Plan   1. Cardiac/VF Arrest - LHC with severe chronic 3 vessel disease -s/p hypothermia protocol and Impella placement -Impella removed 6/29 due to RLE weakness/ischemia - Sedated on vent. Head CT 6/29. No acute process.   2. Acute Systolic Heart Failure -> Cardiogenic Shock  -EF 20% by echo 6/27 - Marked decompensation 07/28/2016 with profound shock and MSOF.  - CVP 6. With peripheral edema increase lasix to 10 mg/hr for now.   - Now with IABP and dual pressor support. Tolerating slow pressor wean.  - Prognosis remains extremely poor.  3. Acute Respiratory Failure - Intubated. Now on 80%. Plugged ETT this am.   - CXR 08/21/2016 with mild decrease in pulmonary edema and aortic balloon pump within descending thoracic aorta just below level of aortic knob.  4. VT/VF - 6/28 with shock x2. Recurrent VT/VF 6/30 with shock  Continue IV amio. Keep K >4 and Mg > 2. No further VT.  - Would need ICD vs Lifevest if recovers. Prognosis extremely  poor. No change.  5. CAD - 3 vessel disease, severe - Continue ASA. No change.  6. PAD/Limb ischemia - Head CT 6/29 negative - ? Nerve injury vs psoas bleed vs vascular ischemia.  - s/p RLE emergent thrombectomy, endarterectomy and fasciotomy on 7/1 - LLE appears ischemic. IABP still in place. Consider wean, but complicated due to ischemia = Will discuss timing with MD 7. Hyperkalemia - 5.5. Plan to repeat at 1000 am.  - Kayexalate has not been effective.  Will try sorbitol.  8. Hyponatremia - Relatively stable at 129 this am.  9. Elevated WBC - ?aspiration PNA. Covering with vanc/zosyn. No change.  - Vanc level pending.  Afebrile.  10. Shock Liver - AST/ALT trending back up.   11. Acute blood loss anemia - Relatively stable. Hgb 10.1 -> 9.5.   12. AKI - Due to shock/ATN - Creatinine up 1.3 ->1.6 -> 1.9 -> 2.39 -> 3.29. Continue to follow.  13. Enerococcal UTI - On vanc and zosyn. Continue. Afebrile.  - BCx remain negative.   Patient is critically ill and in multi-system organ failure. Prognosis remains extremely guarded.  Length of Stay: [address]  Annamaria Helling  08/04/2016, 7:59 AM  Advanced Heart Failure Team Pager [phone] (M-F; 7a - 4p)  Please contact [facility] for night-coverage after hours (4p -7a ) and weekends on [email or web address]   Agree with above.  He remains critically ill with multisystem organ failure. Luiz Blare numbers reviewed personally. Cardiac output improved and pressor requirements coming down. However continues to require a high-level of vent support and IABP. Creatinine getting worse.   Exam On vent Cor RRR  Lungs +rhonchi Ab distended Ex 3+ edema R leg wounds ok. Warm. LLE perfusion improved. IABP site ok  Neuro sedated but awakens at times  Continues with MSOF. Will continue full support. Attempt to wean IABP today or tomorrow - though I am concerned that holding manual pressure on site may lead to limb ischemia (?  Closure device). Increase  lasix gtt. Continue abx. Watch renal function closely. May need CVVHD. Family updated.   CRITICAL CARE Performed by: Glori Bickers  Total critical care time: 45 minutes  Critical care time was exclusive of separately billable procedures and treating other patients.  Critical care was necessary to treat or prevent imminent or life-threatening deterioration.  Critical care was time spent personally by me (independent of midlevel providers or residents) on the following activities: development of treatment plan with patient and/or surrogate as well as nursing, discussions with consultants, evaluation of patient's response to treatment, examination of patient, obtaining history from patient or surrogate, ordering and performing treatments and interventions, ordering and review of laboratory studies, ordering and review of radiographic studies, pulse oximetry and re-evaluation of patient's condition.  Glori Bickers, MD  8:44 AM

## 2016-08-05 ENCOUNTER — Inpatient Hospital Stay (HOSPITAL_COMMUNITY): Payer: Medicare Other

## 2016-08-05 LAB — GLUCOSE, CAPILLARY
GLUCOSE-CAPILLARY: 120 mg/dL — AB (ref 65–99)
Glucose-Capillary: 124 mg/dL — ABNORMAL HIGH (ref 65–99)
Glucose-Capillary: 143 mg/dL — ABNORMAL HIGH (ref 65–99)
Glucose-Capillary: 158 mg/dL — ABNORMAL HIGH (ref 65–99)

## 2016-08-05 LAB — COOXEMETRY PANEL
CARBOXYHEMOGLOBIN: 0.8 % (ref 0.5–1.5)
METHEMOGLOBIN: 1.3 % (ref 0.0–1.5)
O2 SAT: 75.4 %
TOTAL HEMOGLOBIN: 9.3 g/dL — AB (ref 12.0–16.0)

## 2016-08-05 LAB — POCT I-STAT 3, ART BLOOD GAS (G3+)
Acid-Base Excess: 3 mmol/L — ABNORMAL HIGH (ref 0.0–2.0)
Bicarbonate: 25.6 mmol/L (ref 20.0–28.0)
O2 SAT: 99 %
PCO2 ART: 31.3 mmHg — AB (ref 32.0–48.0)
PH ART: 7.521 — AB (ref 7.350–7.450)
Patient temperature: 36.7
TCO2: 27 mmol/L (ref 0–100)
pO2, Arterial: 127 mmHg — ABNORMAL HIGH (ref 83.0–108.0)

## 2016-08-05 LAB — CULTURE, BLOOD (ROUTINE X 2)
Culture: NO GROWTH
Culture: NO GROWTH

## 2016-08-05 LAB — COMPREHENSIVE METABOLIC PANEL
ALBUMIN: 1.4 g/dL — AB (ref 3.5–5.0)
ALK PHOS: 97 U/L (ref 38–126)
ALT: 201 U/L — AB (ref 17–63)
ANION GAP: 16 — AB (ref 5–15)
AST: 655 U/L — AB (ref 15–41)
BILIRUBIN TOTAL: 1.4 mg/dL — AB (ref 0.3–1.2)
BUN: 58 mg/dL — AB (ref 6–20)
CALCIUM: 6 mg/dL — AB (ref 8.9–10.3)
CO2: 20 mmol/L — AB (ref 22–32)
CREATININE: 4.21 mg/dL — AB (ref 0.61–1.24)
Chloride: 93 mmol/L — ABNORMAL LOW (ref 101–111)
GFR calc Af Amer: 15 mL/min — ABNORMAL LOW (ref >60)
GFR calc non Af Amer: 13 mL/min — ABNORMAL LOW (ref >60)
GLUCOSE: 115 mg/dL — AB (ref 65–99)
Potassium: 5.6 mmol/L — ABNORMAL HIGH (ref 3.5–5.1)
SODIUM: 129 mmol/L — AB (ref 135–145)
TOTAL PROTEIN: 4.7 g/dL — AB (ref 6.5–8.1)

## 2016-08-05 LAB — CBC
HEMATOCRIT: 26.5 % — AB (ref 39.0–52.0)
HEMOGLOBIN: 9 g/dL — AB (ref 13.0–17.0)
MCH: 29.3 pg (ref 26.0–34.0)
MCHC: 34 g/dL (ref 30.0–36.0)
MCV: 86.3 fL (ref 78.0–100.0)
Platelets: 77 10*3/uL — ABNORMAL LOW (ref 150–400)
RBC: 3.07 MIL/uL — AB (ref 4.22–5.81)
RDW: 16 % — ABNORMAL HIGH (ref 11.5–15.5)
WBC: 17.7 10*3/uL — ABNORMAL HIGH (ref 4.0–10.5)

## 2016-08-05 LAB — CULTURE, RESPIRATORY W GRAM STAIN

## 2016-08-05 LAB — CULTURE, RESPIRATORY

## 2016-08-05 LAB — VANCOMYCIN, RANDOM: Vancomycin Rm: 27

## 2016-08-05 LAB — MAGNESIUM: Magnesium: 2.6 mg/dL — ABNORMAL HIGH (ref 1.7–2.4)

## 2016-08-05 LAB — HEPARIN LEVEL (UNFRACTIONATED): Heparin Unfractionated: 0.27 IU/mL — ABNORMAL LOW (ref 0.30–0.70)

## 2016-08-05 LAB — PHOSPHORUS: Phosphorus: 9.7 mg/dL — ABNORMAL HIGH (ref 2.5–4.6)

## 2016-09-02 NOTE — Progress Notes (Signed)
   VASCULAR SURGERY ASSESSMENT & PLAN:   POD 2: RIGHT FEMORAL THROMBECTOMY RIGHT COMMON FEMORAL ARTERY ENDARTERECTOMY VEIN PATCH ANGIOPLASTY OF THE RIGHT COMMON FEMORAL ARTERY 4 COMPARTMENT FASCIOTOMY AND PLACEMENT OF NEGATIVE PRESSURE DRESSINGS   The right foot has good Doppler flow. Cont VAC change 3X/ week   Has doppler flow in the left foot. Still dependent upon the IABP. On pressors still.    JP no drainage.Discontinue if family wishes to proceed with aggressive care.    SUBJECTIVE:   Sedated on vent  PHYSICAL EXAM:   Vitals:   2016/08/24 0630 2016-08-24 0645 08/24/16 0700 Aug 24, 2016 0728  BP: 100/68  112/75   Pulse:      Resp: 18 15 (!) 23   Temp: 97.7 F (36.5 C) 97.9 F (36.6 C) 98.1 F (36.7 C)   TempSrc:   Core (Comment)   SpO2: 100% 100% 100% 100%  Weight:  229 lb 11.5 oz (104.2 kg)    Height:       Doppler flow both feet Prevena dressing on right groin. VAC on fasciotomy sites right leg  LABS:   Lab Results  Component Value Date   WBC 17.7 (H) 24-Aug-2016   HGB 9.0 (L) 2016/08/24   HCT 26.5 (L) Aug 24, 2016   MCV 86.3 08-24-16   PLT 77 (L) 24-Aug-2016   Lab Results  Component Value Date   CREATININE 4.21 (H) August 24, 2016   Lab Results  Component Value Date   INR 1.24 08/07/2016   CBG (last 3)   Recent Labs  08/24/16 0005 08/24/16 0332 2016/08/24 0738  GLUCAP 143* 120* 124*    PROBLEM LIST:    Active Problems:   Cardiac arrest (HCC)   Ventricular tachycardia (HCC)   Coronary artery disease involving native coronary artery   Pressure injury of skin   Cardiogenic shock (HCC)   Acute respiratory failure with hypoxia (HCC)   CURRENT MEDS:   . aspirin  81 mg Oral Daily  . chlorhexidine gluconate (MEDLINE KIT)  15 mL Mouth Rinse BID  . Chlorhexidine Gluconate Cloth  6 each Topical Daily  . feeding supplement (PRO-STAT SUGAR FREE 64)  30 mL Per Tube 5 X Daily  . feeding supplement (VITAL HIGH PROTEIN)  1,000 mL Per Tube Q24H  .  fentaNYL (SUBLIMAZE) injection  50 mcg Intravenous Once  . insulin aspart  0-20 Units Subcutaneous Q4H  . insulin aspart  1 Units Subcutaneous Q4H  . insulin glargine  10 Units Subcutaneous Daily  . mouth rinse  15 mL Mouth Rinse 10 times per day  . pantoprazole sodium  40 mg Per Tube Daily  . sodium chloride flush  10-40 mL Intracatheter Q12H  . sodium chloride flush  3 mL Intravenous Q12H    Gae Gallop Beeper: [phone] Office: [phone] 2016/08/24

## 2016-09-02 NOTE — Progress Notes (Signed)
Pt terminally weaned by Dr. Gala RomneyBensimhon.

## 2016-09-02 NOTE — Progress Notes (Signed)
ANTICOAGULATION CONSULT NOTE - Follow Up Consult  Pharmacy Consult for Heparin  Indication: IABP   No Known Allergies  Patient Measurements: Height: 5\' 10"  (177.8 cm) Weight: 229 lb 11.5 oz (104.2 kg) IBW/kg (Calculated) : 73 Heparin Dosing Weight: 95kg  Vital Signs: Temp: 98.1 F (36.7 C) (07/04 0700) Temp Source: Core (Comment) (07/04 0700) BP: 112/75 (07/04 0700) Pulse Rate: 96 (07/04 0336)  Labs:  Recent Labs  08/03/16 1747  08/04/16 0259 08/04/16 0807 08/04/16 1615 08/22/2016 0333  HGB 9.8*  --  9.5*  --   --  9.0*  HCT 29.4*  --  28.4*  --   --  26.5*  PLT 91*  --  77*  --   --  77*  HEPARINUNFRC  --   < >  --  0.28* 0.28* 0.27*  CREATININE 2.87*  --  3.29*  --   --  4.21*  < > = values in this interval not displayed.  Estimated Creatinine Clearance: 19.7 mL/min (A) (by C-G formula based on SCr of 4.21 mg/dL (H)).   Medications:  Heparin @ 1250 units/hr  Assessment: 70 yom continues on heparin for IABP, s/p right femoral artery thrombectomy 7/1 after impella pulled. Also now has ischemic left leg - no intervention yet per VVS - perfusion actually improved today flow by doppler. Heparin level remains therapeutic at 0.27. Platelets with slow trend down but relatively stable 70s in IABP. No bleeding. Hgb low stable  Goal of Therapy:  Heparin level 0.2-0.5 units/ml Monitor platelets by anticoagulation protocol: Yes   Plan:  1) Continue heparin at 1250 units/hr 2) Follow up daily heparin level and CBC  Jay Livingston Pharm.D. CPP, BCPS Clinical Pharmacist [phone] 08/14/2016 8:43 AM

## 2016-09-02 NOTE — Death Summary Note (Signed)
  Advanced Heart Failure Death Summary  Death Summary   Patient ID: Jay Livingston MRN: [id number], DOB/AGE: 71/02/1945 71 y.o. Admit date: 11/30/16 D/C date:     08/18/2016   Primary Discharge Diagnoses:  1. Cardiac VF Arrest 2. Cardiogenic shock /Acute systolic heart failure 3. Acute respiratory failure 4. VT/VF 5. CAD 6. PAD/Limb ischemia 7. Hyperkalemia 8. Hyponatremia 9 Leukocytosis 10. Shock Liver/Transaminitis 11. AKI 12. Enterococcal UTI  Consults Vascular surgery HF team CCM  Hospital Course:   Jay Livingston was a 71 y.o. male who presented 2016-12-08 in setting of cardiac arrest. Pt was found unresponsive with agonal breathing. Pt was down approx 10 minutes prior to EMS arrival.  Pt in VT on their arrival. Had 22 minutes of ACLS prior to ROSC. Was intubated emergently in ED and taken urgently to cath lab where 3 vessel disease was discovered.   Pt had impella placed for support, and also required multiple pressors. Pt placed on hypothermia protocol. Troponin trended up to 1.98. Echo 2016-12-08 showed LVEF 10 %. Felt to be primary VF arrest in setting of previously unknown severe ischemic CM.   Pt had repeat VT am of 07/30/16. HF team consulted to assist in managing his cardiogenic shock.  Initial EEG with diffuse slowing. Pt remained tenuous and on pressor support. Impella pulled 07/31/16 due to inability to move R leg and possible leg ischemia  On 07/25/2016 pt developed recurrent VT/VF requiring several shocks. He developed progressive shock, acidosis, and hypoxemia despite increased pressors. He was taken emergently to cath lab for left femoral IABP and swan as a salvage attempt.  Taken to OR 08/04/2016 with ischemic RLE. R femoral artery occluded with clot and plaque. Underwent emergent thombectomy and endartectomy with 4-compartement fasciotomy. Circulation improved over the next several days with improvement of pulses and warming of extremities.   Hospital course additionally  complicated by  enterococcal UTI. ABX added.  Pressors adjusted as needed. Pt remained tenuous and on sedation. CCM followed for vent adjustment.   Repeat EEG 08/04/16 showed diffuse background suppression and lack of EEG reactivity with noxious stimulation indication severe cerebral dysfunction.   With no further medical options for recovery, family opted for comfort care with terminal extubation on 08/17/2016. Pt quickly passed away after extubation on sedation.   Every effort was made to improve Jay Livingston. Ultimately, Jay Livingston passed away from Multiple system organ failure including severe anoxic brain injury in the setting of cardiogenic shock secondary to cardiac arrest.   Duration of Discharge Encounter: Greater than 35 minutes   Signed, Graciella FreerMichael Andrew Tillery, PA-C 08/10/2016, 9:33 AM  Agree with above.   Arvilla MeresBensimhon, Damein Gaunce, MD  3:38 PM

## 2016-09-02 NOTE — Progress Notes (Signed)
Pharmacy Antibiotic Note  Jay Jay Livingston is a 71 y.o. male admitted on 07/06/2016  S/p cardiac arrest.  He continues on day #6 vancomycin and zosyn for HCAP TA staph aureus and enterococcal UTI. Vancomycin random level is therapeutic at 27 - last vancomycin dose 7/3 at 10am. Cr is worsening, up to 4.2 with CrCl ~ 4820ml/min. He is still making some urine and lasix drip increased to 10mg /h. Zosyn dose remains appropriate but will require dose reduction if Cr continues to worsen.  Vancomycin 6/30>> Zosyn 6/30>>  6/29 TA: gnr/gpc- normal flora 6/29 bld x2: ngtd 6/29 urine: 100K enterococcus S - amp/vanc 6/29 MRSA PCR: MRSA neg, staph aureus + 7/1 resp: rare staph aureus   Plan: Continue zosyn EI  Re-evaluate dose in am with renal function Hold vancomycin dose today and recheck level in am for further dosing needs  Height: 5\' 10"  (177.8 cm) Weight: 229 lb 11.5 oz (104.2 kg) IBW/kg (Calculated) : 73  Temp (24hrs), Avg:98 F (36.7 C), Min:97 F (36.1 C), Max:98.9 F (37.2 C)   Recent Labs Lab 05-21-16 0242 05-21-16 0612  05-21-16 1907 05-21-16 2312 08/03/16 0418 08/03/16 0900 08/03/16 1747 08/04/16 0259 08/04/16 0923 08/28/2016 0333  WBC 11.4*  --   < > 10.0  --  10.5  --  12.3* 11.2*  --  17.7*  CREATININE 1.93*  --   < > 1.94*  --  2.39*  --  2.87* 3.29*  --  4.21*  LATICACIDVEN 4.5* 3.6*  --  1.9 1.5  --  1.6  --   --   --   --   VANCOTROUGH  --   --   --   --   --   --   --   --   --  7327*  --   VANCORANDOM  --   --   --   --   --   --   --   --   --   --  27  < > = values in this interval not displayed.  Estimated Creatinine Clearance: 19.7 mL/min (A) (by C-G formula based on SCr of 4.21 mg/dL (H)).    No Known Allergies   Jay SauersLisa Livingston Jay Livingston Pharm.D. CPP, BCPS Clinical Pharmacist [phone] 08/08/2016 8:48 AM

## 2016-09-02 NOTE — Progress Notes (Signed)
  Family at bedside for terminal extubation   Sedation increased. IV drips stopped and IABP deactivated.   Patient decompensated quickly. I then personally extubated him.  Shortly thereafter patient expired at 1254 with family at bedside.   Additional CCT 35 mins.  Arvilla MeresBensimhon, Shantia Sanford, MD  1:15 PM

## 2016-09-02 NOTE — Progress Notes (Signed)
Advanced Heart Failure Rounding Note  Primary Cardiologist: New  Subjective:    Impella pulled 6/29 due to inability to move R leg.  On 6/30 developed recurrent VT/VF receiving several shocks. Developed progressive shock, acidosis and hypoxemia despite increasing pressors.   He was taken emergently to cath lab 6/30 for left femoral IABP and swan as a salvage attempt.   On 7/1 taken to OR for ischemic RLE. R femoral artery occluded with clot and plaque. Underwent emergent  thrombectomy and endarectomy with 4-compartment fasciotomy.   Yesterday , attempted to wean IABP to 1:2 as cardiac output was high. Failed wean with progressive hypotension. IABP taken back to 1:1. Very tenuous overnight. Remains on epi 2.5 and milrinone 0.125. On lasix gtt at 10/hr. U/O ~ 100/hr. Creatinine continues to climb 3.2-> 4.2. Severe skin breakdown. Still not moving RLE.   Versed and Fentanyl restarted due to vent dyssynchrony. EEG with severe diffuse slowing   CVP 9 PA 52/22 CO 7.0 CI 3.2   Objective:   Weight Range: 104.2 kg (229 lb 11.5 oz) Body mass index is 32.96 kg/m.   Vital Signs:   Temp:  [97 F (36.1 C)-98.9 F (37.2 C)] 98.4 F (36.9 C) (07/04 0900) Pulse Rate:  [94-197] 95 (07/04 0900) Resp:  [15-24] 18 (07/04 0900) BP: (92-134)/(44-85) 116/74 (07/04 0900) SpO2:  [88 %-100 %] 100 % (07/04 0900) Arterial Line BP: (79-147)/(43-58) 112/46 (07/04 0900) FiO2 (%):  [50 %-70 %] 50 % (07/04 0800) Weight:  [104.2 kg (229 lb 11.5 oz)] 104.2 kg (229 lb 11.5 oz) (07/04 0645) Last BM Date:  (PTA, laxatives/stool softners given today)  Weight change: Filed Weights   07/11/2016 0400 08/04/16 0500 08-20-2016 0645  Weight: 103.5 kg (228 lb 2.8 oz) 107.1 kg (236 lb 1.8 oz) 104.2 kg (229 lb 11.5 oz)    Intake/Output:   Intake/Output Summary (Last 24 hours) at 08-20-16 0930 Last data filed at 08/20/2016 0900  Gross per 24 hour  Intake          2523.77 ml  Output             2085 ml  Net            438.77 ml      Physical Exam   General: Intubated. Critically ill appearing.  HEENT: +ETT Neck: Supple. RIJ TLC. Carotids 2+ bilat; no bruits. No thyromegaly or nodule noted. Cor: RRR distant Lungs: Diffuse rhonchi  Abdomen: Soft NT. +++ditended. No bowel sounds Extremities: Markedly edematous/anasarca   with multiple areas of skin breakdown  RLE warm with dopplerable pulse. R groin wound packed with JP drain - no drainage.  RLE fasciotomy wounds. LLE cool. Left groin with IABP and swan. Improving ecchymosis.  Neuro: Sedated. Nonresponsive  Telemetry   NSR 80s with VPCs. Personally reviewed   EKG    Not ordered today.    Labs    CBC  Recent Labs  08/04/16 0259 August 20, 2016 0333  WBC 11.2* 17.7*  HGB 9.5* 9.0*  HCT 28.4* 26.5*  MCV 86.9 86.3  PLT 77* 77*   Basic Metabolic Panel  Recent Labs  08/04/16 0259 08/04/16 0923 08/20/2016 0333  NA 128*  --  129*  K 5.5* 5.5* 5.6*  CL 93*  --  93*  CO2 21*  --  20*  GLUCOSE 96  --  115*  BUN 47*  --  58*  CREATININE 3.29*  --  4.21*  CALCIUM 6.0*  --  6.0*  MG 2.4  --  2.6*  PHOS 8.1*  --  9.7*   Liver Function Tests  Recent Labs  08/04/16 0259 2016-09-04 0333  AST 650* 655*  ALT 174* 201*  ALKPHOS 70 97  BILITOT 1.5* 1.4*  PROT 4.5* 4.7*  ALBUMIN 1.5* 1.4*   No results for input(s): LIPASE, AMYLASE in the last 72 hours. Cardiac Enzymes No results for input(s): CKTOTAL, CKMB, CKMBINDEX, TROPONINI in the last 72 hours.  BNP: BNP (last 3 results) No results for input(s): BNP in the last 8760 hours.  ProBNP (last 3 results) No results for input(s): PROBNP in the last 8760 hours.   D-Dimer No results for input(s): DDIMER in the last 72 hours. Hemoglobin A1C No results for input(s): HGBA1C in the last 72 hours. Fasting Lipid Panel No results for input(s): CHOL, HDL, LDLCALC, TRIG, CHOLHDL, LDLDIRECT in the last 72 hours. Thyroid Function Tests No results for input(s): TSH, T4TOTAL, T3FREE,  THYROIDAB in the last 72 hours.  Invalid input(s): FREET3  Other results:   Imaging    Dg Chest Port 1 View  Result Date: 2016-09-04 CLINICAL DATA:  Intubated EXAM: PORTABLE CHEST 1 VIEW COMPARISON:  08/04/2016 FINDINGS: Support devices are stable. Heart is borderline in size. Mild vascular congestion. Bibasilar opacities likely reflect atelectasis. Mild interstitial prominence remains which could reflect mild interstitial edema, unchanged. IMPRESSION: No significant change since prior study. Electronically Signed   By: Rolm Baptise M.D.   On: 09-04-2016 07:53     Medications:     Scheduled Medications: . aspirin  81 mg Oral Daily  . chlorhexidine gluconate (MEDLINE KIT)  15 mL Mouth Rinse BID  . Chlorhexidine Gluconate Cloth  6 each Topical Daily  . feeding supplement (PRO-STAT SUGAR FREE 64)  30 mL Per Tube 5 X Daily  . feeding supplement (VITAL HIGH PROTEIN)  1,000 mL Per Tube Q24H  . fentaNYL (SUBLIMAZE) injection  50 mcg Intravenous Once  . insulin aspart  0-20 Units Subcutaneous Q4H  . insulin aspart  1 Units Subcutaneous Q4H  . insulin glargine  10 Units Subcutaneous Daily  . mouth rinse  15 mL Mouth Rinse 10 times per day  . pantoprazole sodium  40 mg Per Tube Daily  . sodium chloride flush  10-40 mL Intracatheter Q12H  . sodium chloride flush  3 mL Intravenous Q12H    Infusions: . sodium chloride 250 mL (09-04-16 0800)  . sodium chloride 10 mL/hr at 04-Sep-2016 0800  . amiodarone 30 mg/hr (09-04-2016 0800)  . dextrose    . epinephrine 2.5 mcg/min (September 04, 2016 0800)  . fentaNYL infusion INTRAVENOUS 200 mcg/hr (04-Sep-2016 0900)  . furosemide (LASIX) infusion 10 mg/hr (09-04-16 0800)  . heparin 1,250 Units/hr (2016/09/04 0800)  . midazolam (VERSED) infusion 2 mg/hr (09/04/16 0900)  . milrinone 0.125 mcg/kg/min (September 04, 2016 0800)  . norepinephrine (LEVOPHED) Adult infusion Stopped (07/27/2016 2353)  . piperacillin-tazobactam (ZOSYN)  IV 3.375 g (04-Sep-2016 0545)  . vasopressin  (PITRESSIN) infusion - *FOR SHOCK* Stopped (2016/09/04 0300)    PRN Medications: sodium chloride, acetaminophen, dextrose, fentaNYL (SUBLIMAZE) injection, midazolam, ondansetron (ZOFRAN) IV, pneumococcal 23 valent vaccine, sodium chloride flush    Patient Profile   Mr Waldman is a 40 year with no past medical history admitted with cardiac/VF arrest. Cath with severe (chronic 3v CAD). Impella placed. Underwent hypothermia protocol.    Assessment/Plan   1. Cardiac/VF Arrest - LHC with severe chronic 3 vessel disease -s/p hypothermia protocol and Impella placement -Impella removed 6/29 due to RLE weakness/ischemia - Sedated on vent. Head CT  6/29. No acute process.   2. Acute Systolic Heart Failure -> Cardiogenic Shock  -EF 20% by echo 6/27 - Marked decompensation 07/26/2016 with profound shock and MSOF.   - Now with IABP and dual pressor support.  - Prognosis remains extremely poor.  3. Acute Respiratory Failure - Intubated. Now on 50%.  4. VT/VF - 6/28 with shock x2. Recurrent VT/VF 6/30 with shock  Continue IV amio. Supp K and Mg as needed 5. CAD - 3 vessel disease, severe - Continue ASA. No change.  6. PAD/Limb ischemia - Head CT 6/29 negative - ? Nerve injury vs psoas bleed vs vascular ischemia.  - s/p RLE emergent thrombectomy, endarterectomy and fasciotomy on 7/1 - LLE appears ischemic. IABP still in place. Failed wean 7/3 7. Hyperkalemia - 5.6. Kayexalate has not been effective.   8. Hyponatremia - Relatively stable at 129 this am.  9. Elevated WBC - ?aspiration PNA. Covering with vanc/zosyn.  10. Shock Liver - AST/ALT trending back up.   11. AKI - Due to shock/ATN - Creatinine up 1.3 ->1.6 -> 1.9 -> 2.39 -> 3.29-> 4.2 12. Enerococcal UTI - On vanc and zosyn. Continue. Afebrile.  - BCx remain negative.    He has progressive MSOF in the setting of cardiogenic shock. EEG very concerning. We have now passed the point at which I think he can make a meaningful recovery.  Have discussed with family who agree and say he would not want to go through all this. Will switch to comfort care and proceed with terminal extubation.   CRITICAL CARE Performed by: Glori Bickers  Total critical care time: 35 minutes  Critical care time was exclusive of separately billable procedures and treating other patients.  Critical care was necessary to treat or prevent imminent or life-threatening deterioration.  Critical care was time spent personally by me (independent of midlevel providers or residents) on the following activities: development of treatment plan with patient and/or surrogate as well as nursing, discussions with consultants, evaluation of patient's response to treatment, examination of patient, obtaining history from patient or surrogate, ordering and performing treatments and interventions, ordering and review of laboratory studies, ordering and review of radiographic studies, pulse oximetry and re-evaluation of patient's condition.    Length of Stay: 7  Glori Bickers, MD  08-11-2016, 9:30 AM  Advanced Heart Failure Team Pager [phone] (M-F; 7a - 4p)  Please contact [facility] for night-coverage after hours (4p -7a ) and weekends on [email or web address]

## 2016-09-02 NOTE — Progress Notes (Signed)
PULMONARY / CRITICAL CARE MEDICINE   Name: Jay AkinRichard Livingston MRN: [id number] DOB: 04/05/1945    ADMISSION DATE:  07/04/2016 CONSULTATION DATE:  07/07/2016  REFERRING MD:  Eldridge DaceVaranasi  CHIEF COMPLAINT:  Cardiac Arrest  BRIEF SUMMARY: Jay Livingston is a 71 y.o. male with no significant PMH who [facility] as [facility] cardiac [facility].  On morning of 07/17/2016, he was at work sitting at his desk and was found unresponsive with agonal respirations. He was seen roughly 5 minutes prior to this and was normal.  He had 10 minutes of downtime and when EMS arrived, he was found to be in VT without a pulse.  ACLS was performed for 22 minutes before ROSC.  [facility] ED where he was intubated and taken to the cath lab emergently - found to have 3VD without interventions, Impella placed.  PCCM consulted.   SUBJECTIVE:  RN reports family contemplating withdrawal of care after discussions with primary MD.    VITAL SIGNS: BP 123/72   Pulse 97   Temp 98.4 F (36.9 C)   Resp (!) 21   Ht 5\' 10"  (1.778 m)   Wt 229 lb 11.5 oz (104.2 kg)   SpO2 100%   BMI 32.96 kg/m   HEMODYNAMICS: PAP: (41-88)/(20-54) 48/22 CVP:  [6 mmHg-17 mmHg] 8 mmHg CO:  [6.5 L/min-7.3 L/min] 7 L/min CI:  [2.9 L/min/m2-3.3 L/min/m2] 3.2 L/min/m2  VENTILATOR SETTINGS: Vent Mode: PCV FiO2 (%):  [50 %-70 %] 50 % Set Rate:  [18 bmp] 18 bmp PEEP:  [10 cmH20] 10 cmH20 Plateau Pressure:  [19 cmH20-25 cmH20] 25 cmH20  INTAKE / OUTPUT: I/O last 3 completed shifts: In: 4057.4 [I.V.:3467.4; Other:50; NG/GT:170; IV Piggyback:370] Out: 3740 [Urine:2240; Emesis/NG output:1100; Drains:400]  PHYSICAL EXAMINATION: General:  Critically ill male on vent HEENT: MM pink/moist, ETT Neuro: sedate CV: IAPB noise 1:1 ratio, unable to auscultate heart tones PULM: even/non-labored, lungs bilaterally coarse  WU:JWJXGI:soft, non-tender, bsx4 active  Extremities: warm/dry, generalized 1-2+ edema  Skin: no rashes or lesions   LABS:  BMET  Recent  Labs Lab 08/03/16 1747 08/04/16 0259 08/04/16 0923 12-09-16 0333  NA 129* 128*  --  129*  K 5.2* 5.5* 5.5* 5.6*  CL 94* 93*  --  93*  CO2 23 21*  --  20*  BUN 43* 47*  --  58*  CREATININE 2.87* 3.29*  --  4.21*  GLUCOSE 64* 96  --  115*   Electrolytes  Recent Labs Lab 07/13/2016 1913  08/03/16 0418 08/03/16 1747 08/04/16 0259 12-09-16 0333  CALCIUM 6.4*  < > 5.9* 6.5* 6.0* 6.0*  MG 1.8  < > 2.2  --  2.4 2.6*  PHOS 5.4*  --   --   --  8.1* 9.7*  < > = values in this interval not displayed.  CBC  Recent Labs Lab 08/03/16 1747 08/04/16 0259 12-09-16 0333  WBC 12.3* 11.2* 17.7*  HGB 9.8* 9.5* 9.0*  HCT 29.4* 28.4* 26.5*  PLT 91* 77* 77*   Coag's  Recent Labs Lab 07/24/2016 2128 08/07/2016 0612  APTT 63* 51*  INR 1.18 1.24   Sepsis Markers  Recent Labs Lab 08/03/2016 0310  08/07/2016 1907 08/15/2016 2312 08/03/16 0418 08/03/16 0900 08/04/16 0259  LATICACIDVEN  --   < > 1.9 1.5  --  1.6  --   PROCALCITON 5.38  --   --   --  8.25  --  9.56  < > = values in this interval not displayed.  ABG  Recent Labs Lab 2016/08/03 2331 08/03/16 0429 08/04/16 0310  PHART 7.414 7.419 7.398  PCO2ART 43.0 43.0 38.8  PO2ART 76.0* 63.0* 115*   Liver Enzymes  Recent Labs Lab 08/03/16 0418 08/04/16 0259 08/26/2016 0333  AST 379* 650* 655*  ALT 119* 174* 201*  ALKPHOS 60 70 97  BILITOT 1.3* 1.5* 1.4*  ALBUMIN 1.6* 1.5* 1.4*   Cardiac Enzymes  Recent Labs Lab 07/03/2016 1839 07/30/16 0309 07/30/16 0501  TROPONINI 2.64* 1.98* 1.69*   Glucose  Recent Labs Lab 08/04/16 1140 08/04/16 1554 08/04/16 1925 08/26/2016 0005 08/18/2016 0332 08/17/2016 0738  GLUCAP 143* 152* 158* 143* 120* 124*   Imaging Dg Chest Port 1 View  Result Date: 08/04/2016 CLINICAL DATA:  Intubated EXAM: PORTABLE CHEST 1 VIEW COMPARISON:  08/04/2016 FINDINGS: Support devices are stable. Heart is borderline in size. Mild vascular congestion. Bibasilar opacities likely reflect atelectasis. Mild  interstitial prominence remains which could reflect mild interstitial edema, unchanged. IMPRESSION: No significant change since prior study. Electronically Signed   By: Charlett Nose M.D.   On: 08/18/2016 07:53   STUDIES:  Cath 6/27 > No interventions Echo 6/27 > ef 20% EEG 6/27 > Inconlusive EEG 7/3 > severe diffuse slowing  CULTURES: Ucc 6/29 >> Enterococcus Fecalis >> pan sensitive Bcx 6/29 >> negative Resp Cx 7/1 >> GPCs  ANTIBIOTICS: Vanco 6/30 >>  Zosyn 6/30 >>  SIGNIFICANT EVENTS: 6/27  Admit, hypothermia protocol 6/29  Impella removed 6/30  IABP placed 7/01  Rt LE fasciotomy and enarterectomy  LINES/TUBES: ETT 6/27 >> 6/27 LIJ cvl >> Rt femoral impella>> removed 6/29 Lt IABP 6/30 (1:2) >>   DISCUSSION: 71 y.o. male admitted 6/27 after VT cardiac arrest. Had 10 minutes downtime before ACLS started then 22 minutes prior to ROSC.  Intubated and taken to cath lab emergently. VT again post cath 6/27 and Amio drip  ASSESSMENT / PLAN:  PULMONARY A: Respiratory insufficiency - following VT cardiac arrest. P:   PRVC 8cc/kg Wean PEEP / FiO2 for sats > 92% Intermittent CXR / ABG Not a candidate for weaning at this time  CARDIOVASCULAR A:  VT arrest - s/p emergent cath 6/27 with 3VD s/p impella placement. VT again 6/27 amio drip started Impella per cards P:  Mgmt per Cardiology  Continue epi, milrinone, lasix gtt, heparin gtt's  ICU monitoring  IABP 1:1 ratio D/C cooling pads as no longer sticking to patient > well past post re-warm/normothermia DNR in the event of arrest  RENAL A:   AKI Hyperkalemia Pseudohypocalcemia - corrects to 8.1 7/4 P:   Trend BMP / urinary output Replace electrolytes as indicated Avoid nephrotoxic agents, ensure adequate renal perfusion Lasix gtt @ 10mg /hr Would need HD if family wants to continue with support  GASTROINTESTINAL A:   GI prophylaxis. Elevated LFT's / Shock Liver Nutrition. P:   Hold TF until family makes  decision regarding plan of care PPI for SUP  HEMATOLOGIC A:   Anemia VTE Prophylaxis. At risk for hypothermia induced coagulopathy. P:  Trend CBC   INFECTIOUS A:   E fecalis UTI Concern for HCAP P:   Continue Vanco / Zosyn  Plan for 8 days abx  ENDOCRINE A:   Hyperglycemia P:   SSI coverage  NEUROLOGIC A:   Acute encephalopathy Following commands after rewarming P:   Fentanyl gtt for pain / sedation EEG reviewed with family per Dr. Gala Romney > family contemplating withdrawal of care Will hold Neurology consult for now    Family updated: No family at  bedside 7/4 am.  Updated per Cardiology.    CC Time: 30 minutes  Canary Brim, NP-C [facility] Pgr: [phone] or if no answer [phone] 08/30/2016, 11:07 AM

## 2016-09-02 DEATH — deceased

## 2018-06-23 IMAGING — DX DG CHEST 1V PORT
1 series · 1 of 1 positions shown · non-contrast
Comparison: 07/31/2016

CLINICAL DATA: Desaturation, shortness of breath

EXAM:
PORTABLE CHEST 1 VIEW

[chest ap]
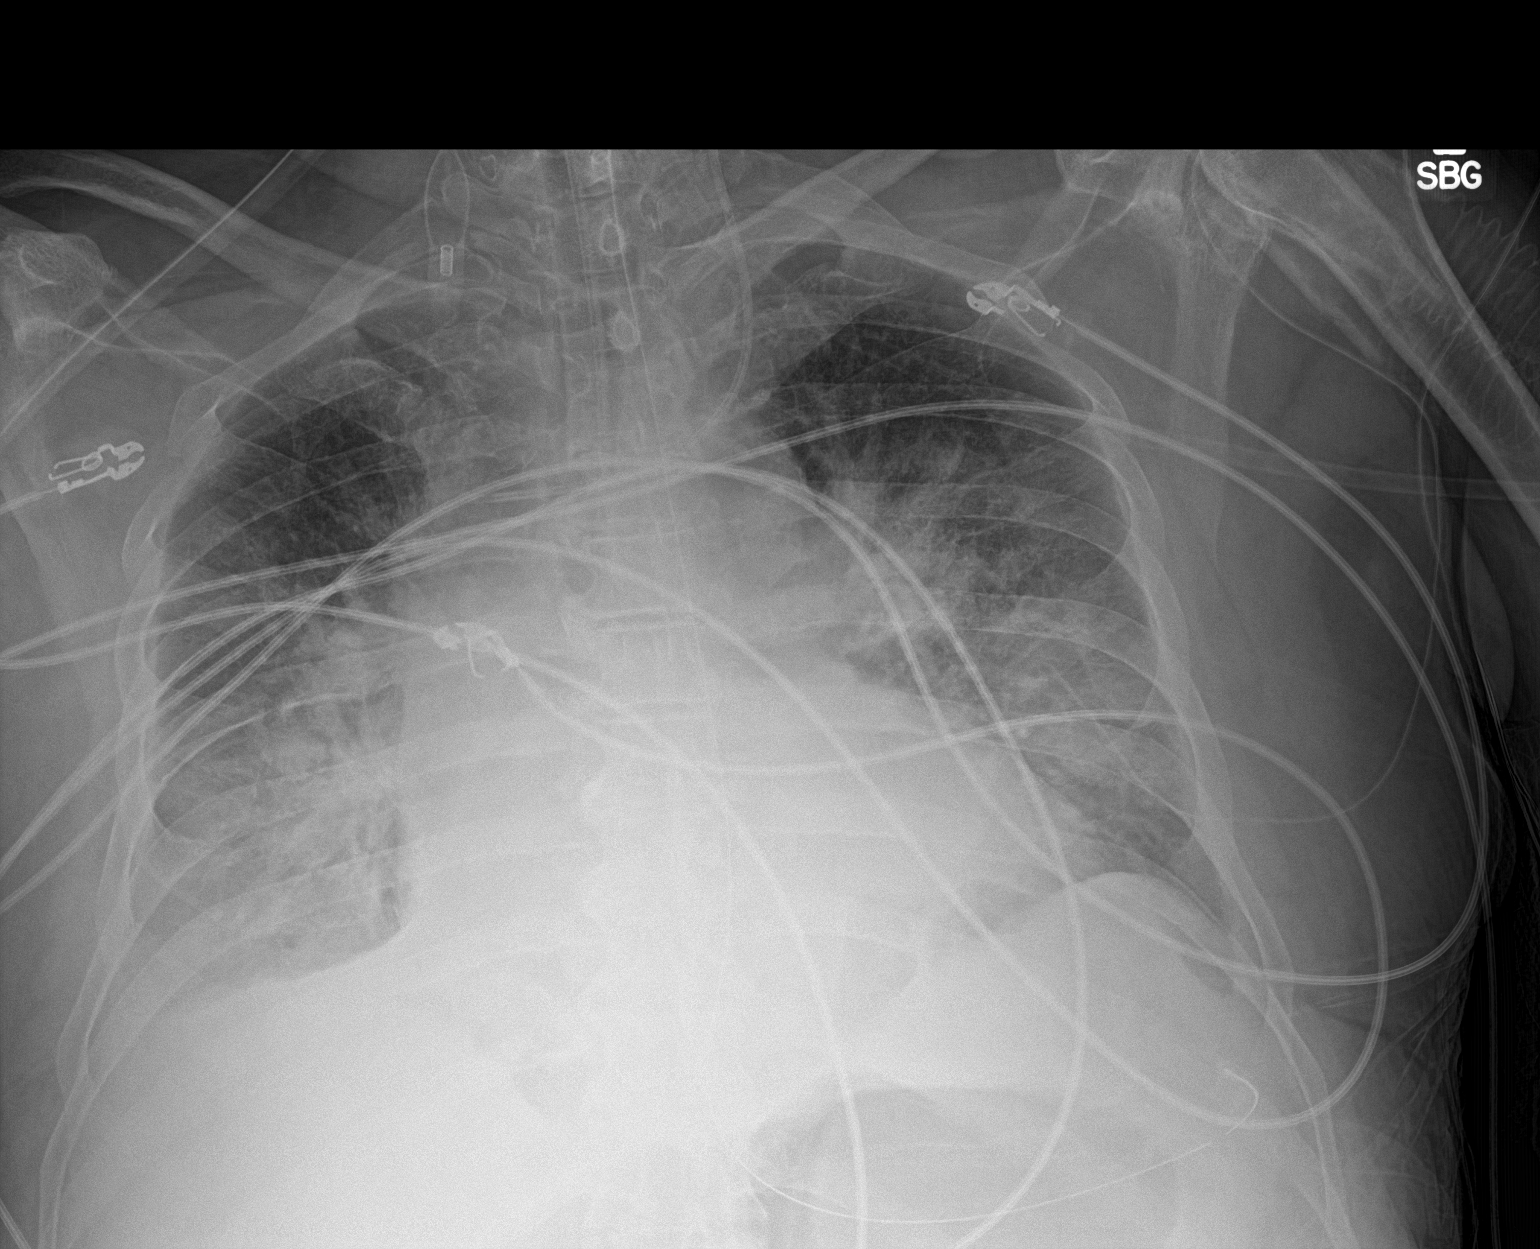

[1 of 1 positions shown; findings below may reference images not displayed]

FINDINGS: Endotracheal tube tip approximately 1 cm superior to the carina.
Esophageal tube tip is in the left upper quadrant. Cardiomegaly with
increased perihilar opacities suggestive of edema. Small pleural
effusions bilaterally, increased on the right side. No pneumothorax.
Left-sided central venous catheter tip directed to the patient's
right over the SVC confluence
IMPRESSION: 1. Small bilateral pleural effusions, increased on the right
2. Cardiomegaly with worsening of perihilar airspace opacities
suspicious for pulmonary edema.

## 2018-06-23 IMAGING — CT CT HEAD W/O CM
4 series · 16 of 47 positions shown, 18 images · non-contrast
Comparison: None.

CLINICAL DATA: 70 y/o M; status post hypothermia protocol. Patient
not moving right leg.

EXAM:
CT HEAD WITHOUT CONTRAST
TECHNIQUE: Contiguous axial images were obtained from the base of the skull
through the vertex without intravenous contrast.

[Series 3: head without · axial · non-contrast · 0.45mm/px · z∈[-123,-3]mm · 7 of 34 slices shown, 9 images]
[im 5/34  brain]
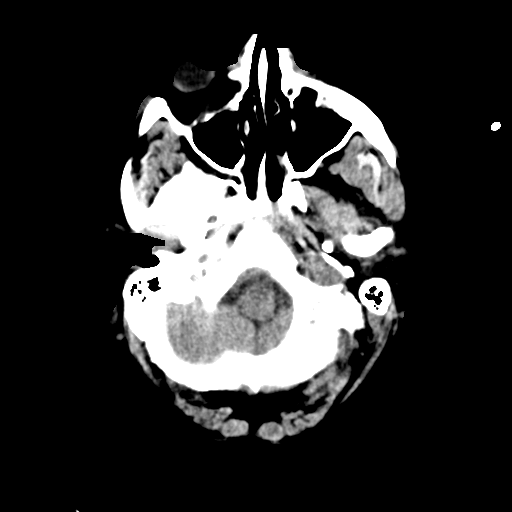
[im 5/34  bone]
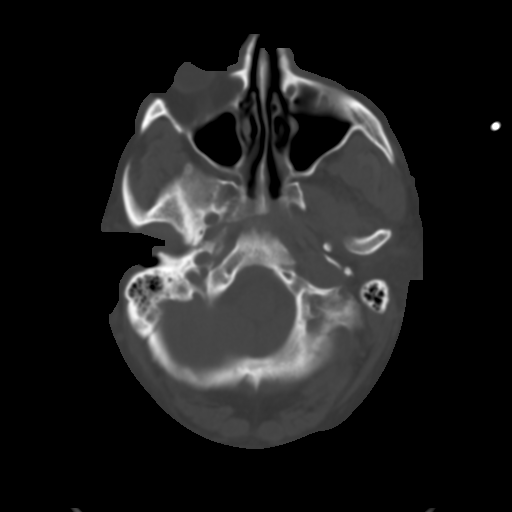
[im 9/34  brain]
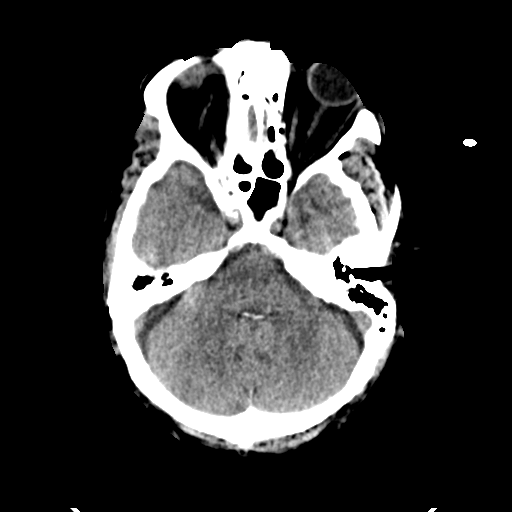
[im 13/34  brain]
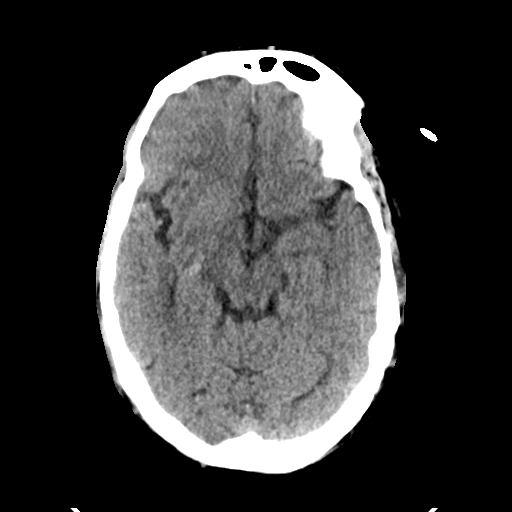
[im 17/34  brain]
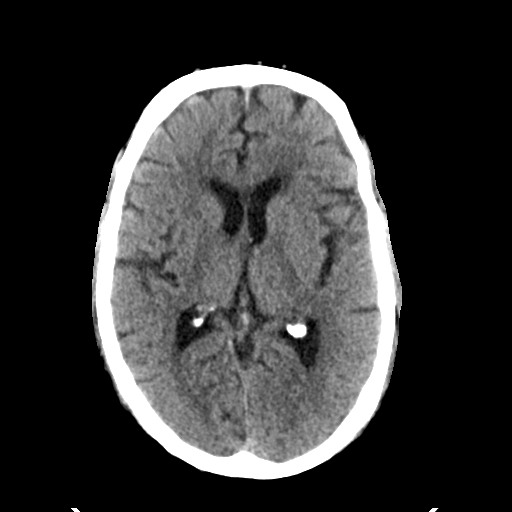
[im 21/34  brain]
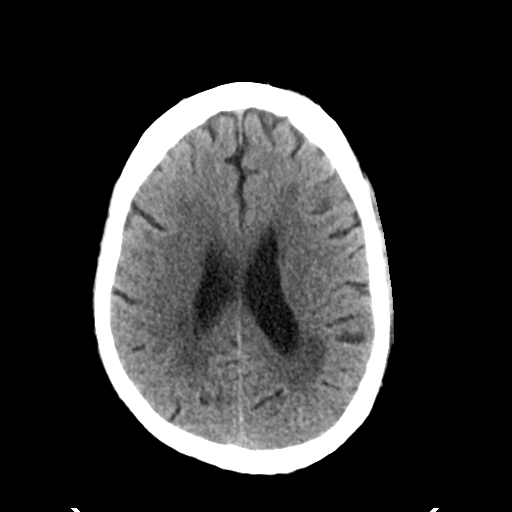
[im 21/34  bone]
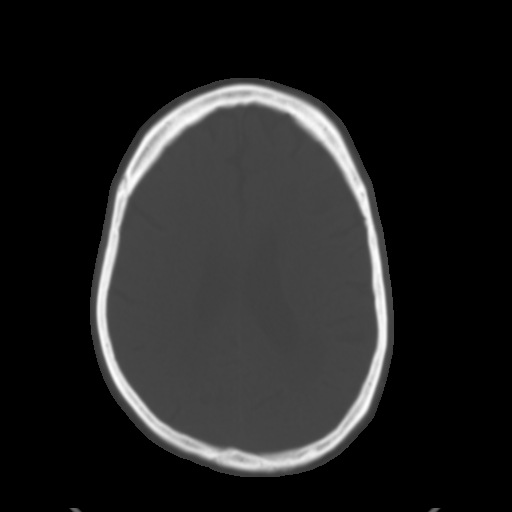
[im 25/34  brain]
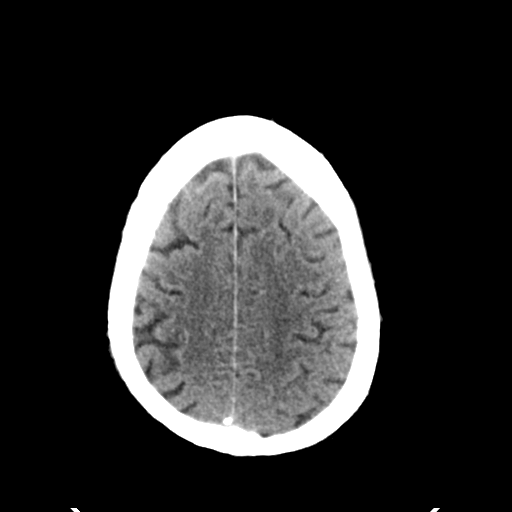
[im 29/34  brain]
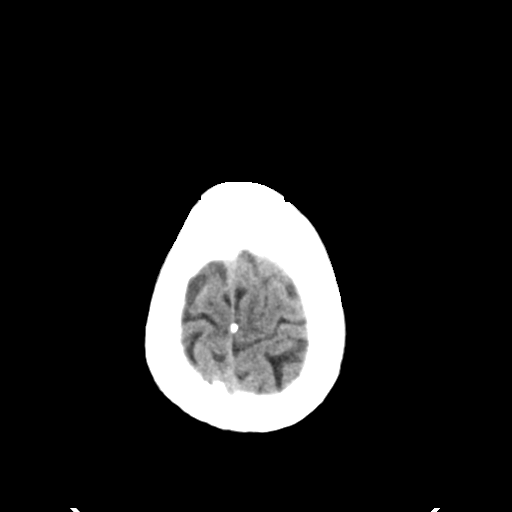

[Series 4: head bone · axial · 0.45mm/px · z∈[-127,-93]mm · 3 of 85 slices shown]
[im 9/85  bone]
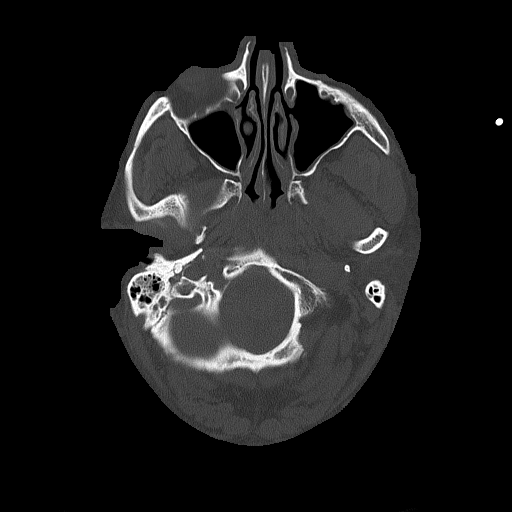
[im 17/85  bone]
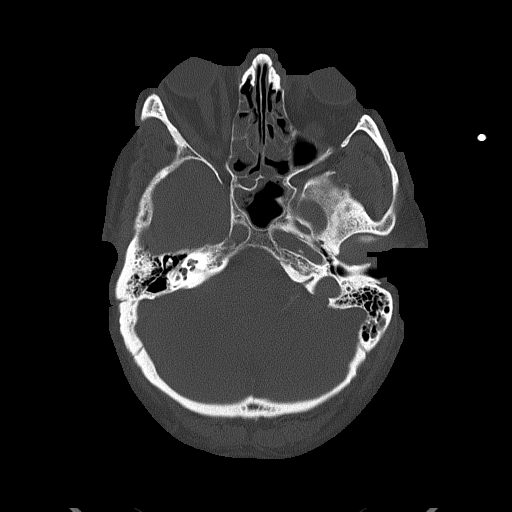
[im 26/85  bone]
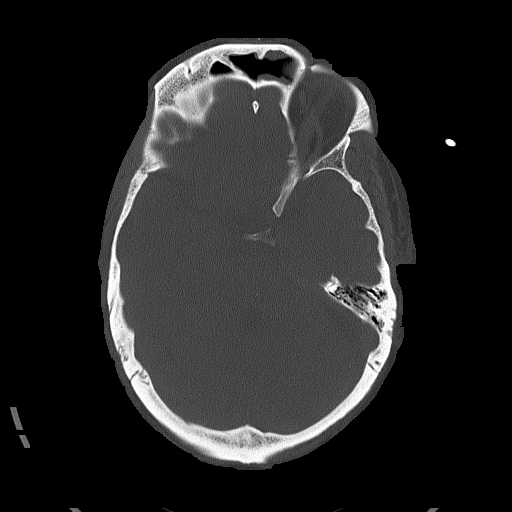

[Series 5: head without cor · coronal · non-contrast · 0.33mm/px · 3 of 72 slices shown]
[im 24/72  brain]
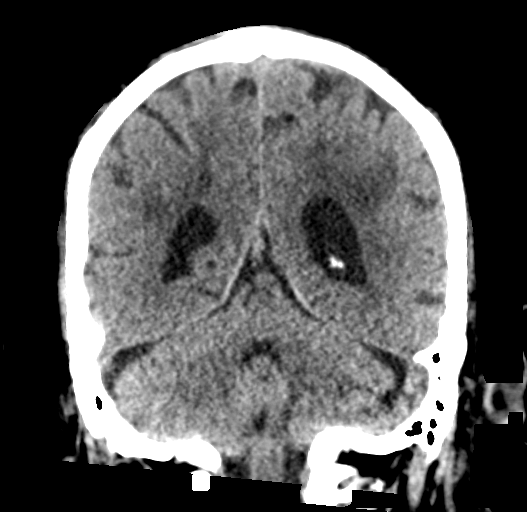
[im 32/72  brain]
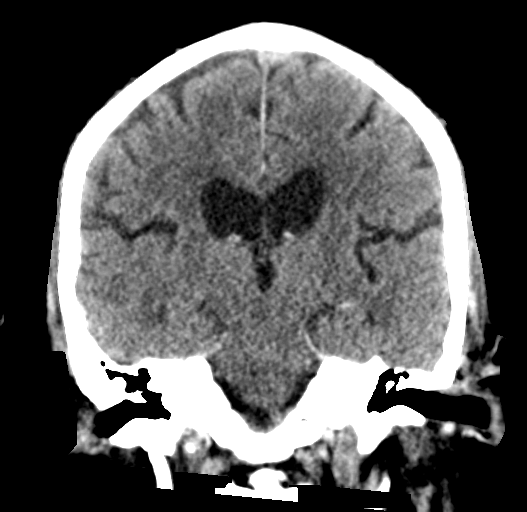
[im 40/72  brain]
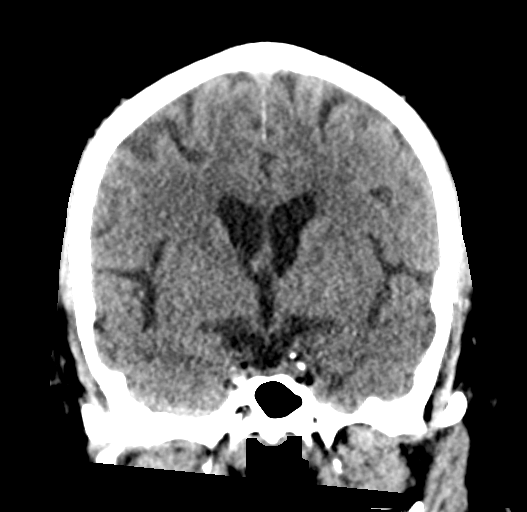

[Series 6: head without sag · sagittal · non-contrast · 0.31mm/px · 3 of 57 slices shown]
[im 19/57  brain]
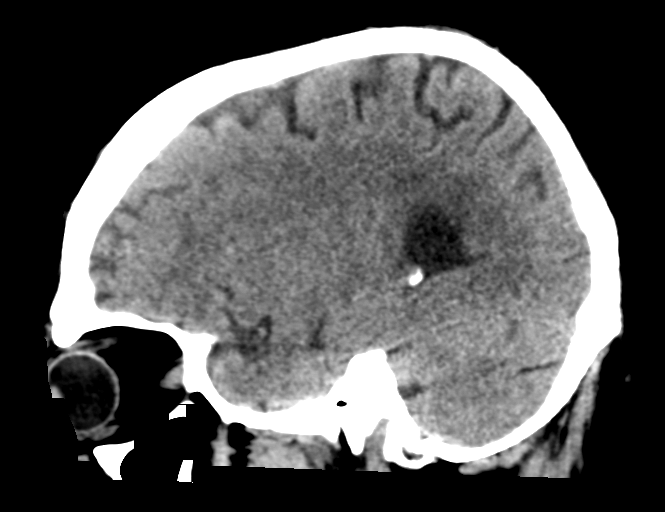
[im 29/57  brain]
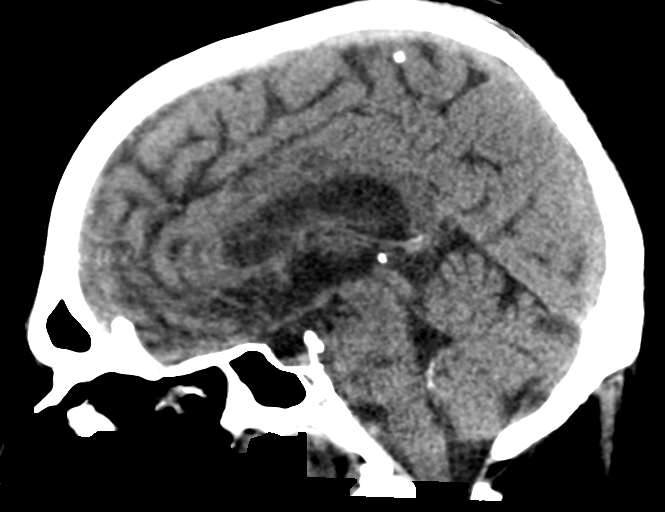
[im 38/57  brain]
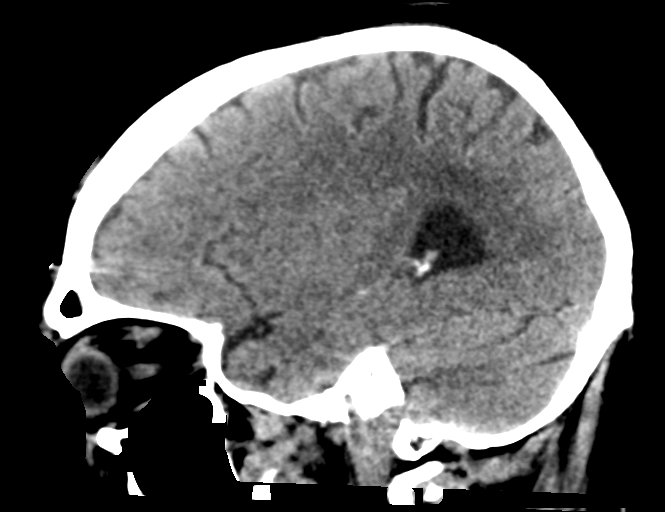

[16 of 47 positions shown; findings below may reference images not displayed]

FINDINGS: Brain: 2 punctate foci of increased density of present within the
pons (series 3, image 10 and 11) that are too small to characterize
and may represent mineralization or microhemorrhage. No additional
evidence for intracranial hemorrhage identified.

No evidence for large territory infarct or focal mass effect. There
are a few nonspecific foci of hypoattenuation within subcortical and
periventricular white matter compatible with mild chronic
microvascular ischemic changes. Mild brain parenchymal volume loss.

Vascular: Mild calcific atherosclerosis of the carotid siphons. No
hyperdense vessel identified.

Skull: Normal. Negative for fracture or focal lesion.

Sinuses/Orbits: Moderate diffuse paranasal sinus mucosal thickening
and partial bilateral mastoid effusions probably due to intubation.

Other: Negative.
IMPRESSION: 1. Two punctate focus of increased density within the pons are too
small to characterize. These may represent mineralization or
microhemorrhage. No additional evidence for intracranial hemorrhage.
2. No evidence for large territory infarct or focal mass effect.
3. Mild for age chronic microvascular ischemic changes and mild
parenchymal volume loss of the brain.
These results will be called to the ordering clinician or
representative by the Radiologist Assistant, and communication
documented in the PACS or zVision Dashboard.

By: Lorenz Jumper M.D.

## 2018-06-26 IMAGING — DX DG CHEST 1V PORT
1 series · 1 of 1 positions shown · non-contrast
Comparison: 08/02/2016

CLINICAL DATA: Followup cardiac arrest.

EXAM:
PORTABLE CHEST 1 VIEW

[chest ap]
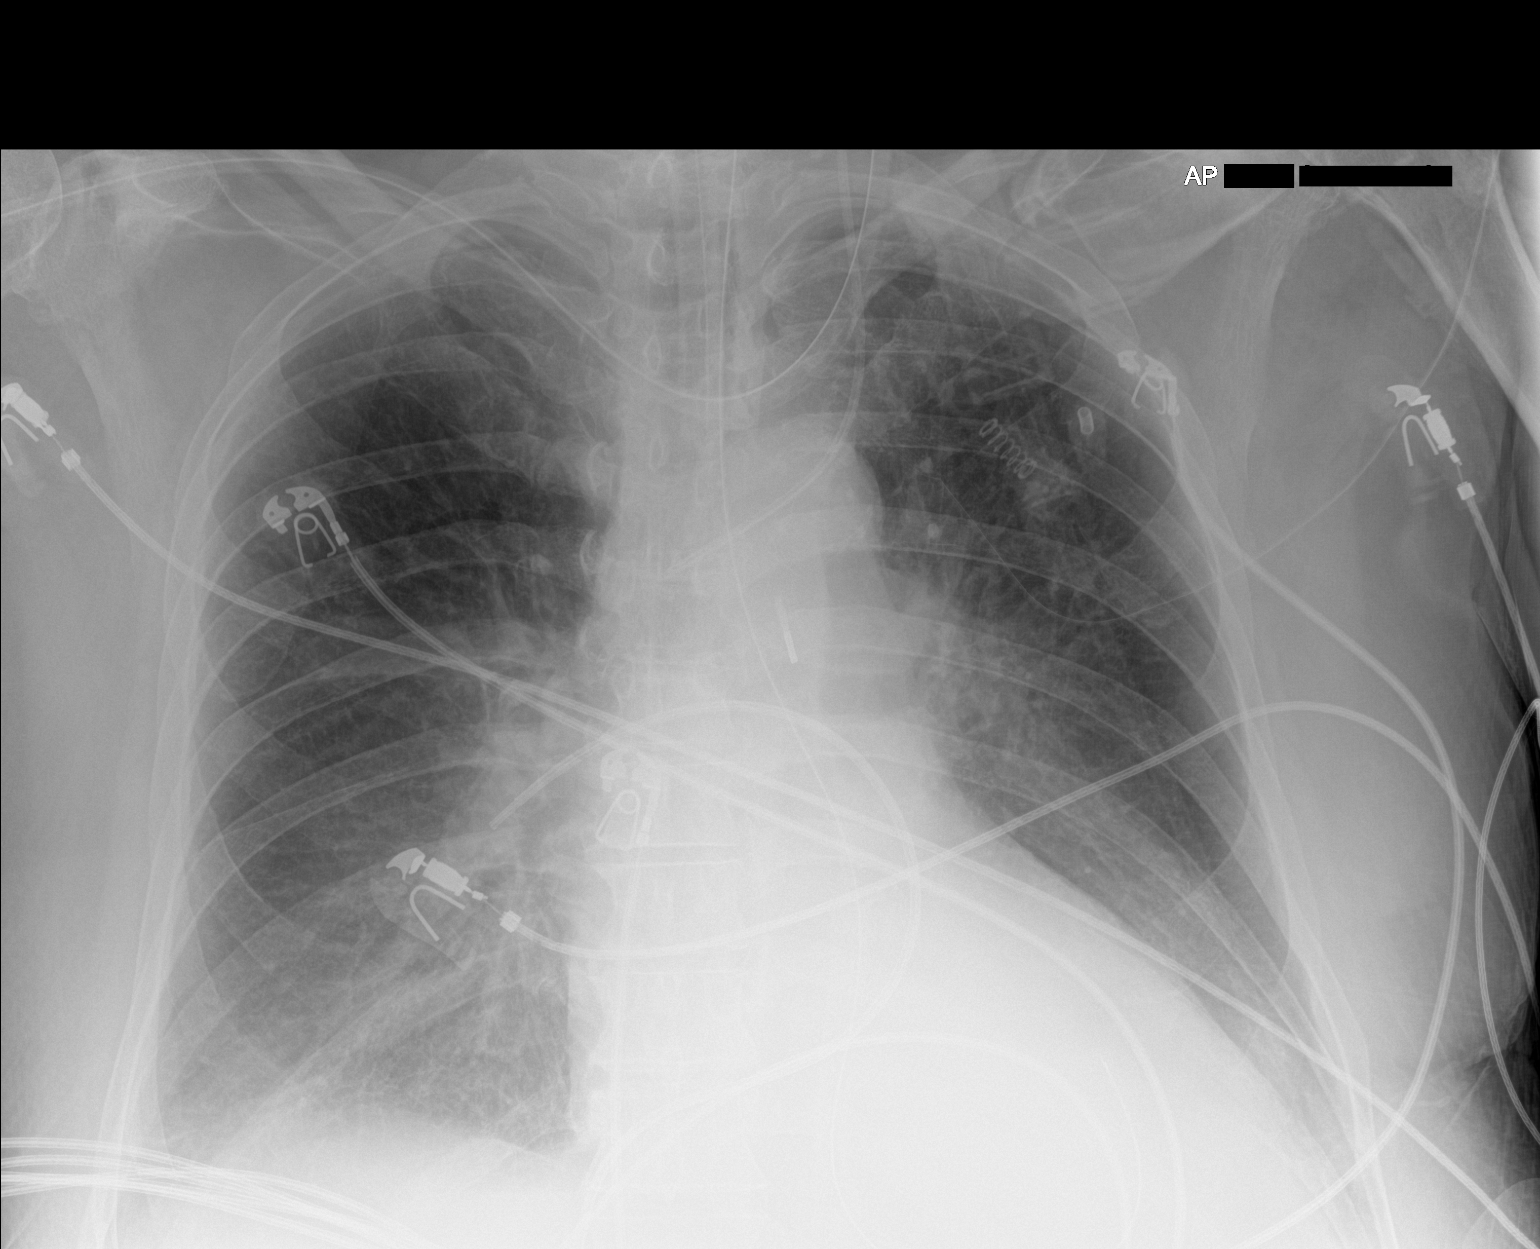

[1 of 1 positions shown; findings below may reference images not displayed]

FINDINGS: Endotracheal tube tip is 5 cm above the carina. Nasogastric tube
enters the stomach. Swan-Ganz catheter introduced from a lower
extremity approach has its tip in the right middle or lower lobe
pulmonary artery. Left internal jugular central line tip at the
innominate SVC junction. Aortic balloon pump projects 3 cm lower
than the aortic arch. Mild pulmonary edema persists. There is
worsened volume loss in the lower lobes left worse than right.
IMPRESSION: Lines and tubes well positioned as above.

Mild persistent edema.

Worsened volume loss in the lower lobes left worse than right.
